# Patient Record
Sex: Female | Born: 1990 | Race: Asian | Hispanic: No | Marital: Married | Smoking: Never smoker
Health system: Southern US, Community
[De-identification: ages and names within clinical notes are randomized; demographics above are authoritative.]

## PROBLEM LIST (undated history)

## (undated) DIAGNOSIS — Z3490 Encounter for supervision of normal pregnancy, unspecified, unspecified trimester: Secondary | ICD-10-CM

## (undated) DIAGNOSIS — O24419 Gestational diabetes mellitus in pregnancy, unspecified control: Secondary | ICD-10-CM

## (undated) DIAGNOSIS — Z8632 Personal history of gestational diabetes: Secondary | ICD-10-CM

## (undated) HISTORY — PX: NO PAST SURGERIES: SHX2092

## (undated) HISTORY — DX: Personal history of gestational diabetes: Z86.32

## (undated) HISTORY — DX: Gestational diabetes mellitus in pregnancy, unspecified control: O24.419

---

## 2014-12-23 NOTE — L&D Delivery Note (Cosign Needed)
Delivery Note Peter CongoBich Cyndie Chimeguyen is a 24 y.o. female G1P0101 with an IUP at [id number] complicated by A1GDM who presented on 10/10/2015 with PPROM of unclear duration (2-7 days per patient). She was assessed to be 4 / 90% / 0 and was subsequently admitted to the Labor & Delivery unit. An epidural was placed at the patient's request. Penicillin was ordered but rapid GBS pcr returned negative and so that order was discontinued. She did not require augmentation.  At 5:07 PM a healthy female was delivered via spontaneous vaginal delivery (Presentation: Left Occiput Anterior).  APGAR: 8, 9; weight 4 lb 15.4 oz (2250 g).    Placenta status: Intact, Expressed, sent for pathology  Cord: 3 vessels  Complications: None.   Anesthesia: Epidural  Episiotomy: None Lacerations: bilateral vaginal side-wall and complex second degree perineal repaired with 3-0 vicryl. DRE performed, sphincter intact. Est. Blood Loss (mL): 83 ml  Mom to postpartum.  Baby to Nursery.  Gerri SporeCaitlin Sullivan 10/10/2015, 5:58 PM  OB FELLOW DELIVERY ATTESTATION   I was gloved and present for delivery in its entirety, I performed the repair, and I agree with the resident's note as above. Silvano BilisNoah B Wouk, MD 6:17 PM

## 2015-09-18 ENCOUNTER — Other Ambulatory Visit (HOSPITAL_COMMUNITY): Payer: Self-pay | Admitting: Nurse Practitioner

## 2015-09-18 DIAGNOSIS — Z3689 Encounter for other specified antenatal screening: Secondary | ICD-10-CM

## 2015-09-18 DIAGNOSIS — Z3A35 35 weeks gestation of pregnancy: Secondary | ICD-10-CM

## 2015-09-18 LAB — OB RESULTS CONSOLE HGB/HCT, BLOOD
HCT: 36 %
HEMOGLOBIN: 12 g/dL

## 2015-09-18 LAB — SICKLE CELL SCREEN
SICKLE CELL SCREEN: NORMAL
Sickle Cell Screen: NORMAL

## 2015-09-18 LAB — LEAD, BLOOD

## 2015-09-18 LAB — OB RESULTS CONSOLE GC/CHLAMYDIA
Chlamydia: NEGATIVE
Gonorrhea: NEGATIVE

## 2015-09-18 LAB — OB RESULTS CONSOLE RUBELLA ANTIBODY, IGM: Rubella: IMMUNE

## 2015-09-18 LAB — CYSTIC FIBROSIS DIAGNOSTIC STUDY: INTERPRETATION-CFDNA: NEGATIVE

## 2015-09-18 LAB — OB RESULTS CONSOLE PLATELET COUNT: PLATELETS: 234 10*3/uL

## 2015-09-18 LAB — OB RESULTS CONSOLE HEPATITIS B SURFACE ANTIGEN: Hepatitis B Surface Ag: NEGATIVE

## 2015-09-18 LAB — OB RESULTS CONSOLE RPR: RPR: NONREACTIVE

## 2015-09-18 LAB — GLUCOSE TOLERANCE, 1 HOUR (50G) W/O FASTING: GLUCOSE 1 HR PRENATAL, POC: 147 mg/dL

## 2015-09-20 LAB — GLUCOSE, 3 HOUR GESTATIONAL
GLUCOSE FASTING: 69 mg/dL (ref 60–109)
Glucose, GTT - 1 Hour: 192 mg/dL (ref ?–200)
Glucose, GTT - 2 Hour: 161 mg/dL — AB (ref ?–140)
Glucose, GTT - 3 Hour: 136 mg/dL (ref ?–140)

## 2015-09-25 ENCOUNTER — Encounter: Payer: Self-pay | Admitting: *Deleted

## 2015-09-25 ENCOUNTER — Encounter: Payer: Medicaid Other | Attending: Obstetrics & Gynecology | Admitting: *Deleted

## 2015-09-25 ENCOUNTER — Encounter: Payer: Self-pay | Admitting: Family Medicine

## 2015-09-25 ENCOUNTER — Ambulatory Visit (INDEPENDENT_AMBULATORY_CARE_PROVIDER_SITE_OTHER): Payer: Medicaid Other | Admitting: Family Medicine

## 2015-09-25 VITALS — BP 108/57 | HR 80 | Temp 98.0°F | Ht <= 58 in | Wt 99.0 lb

## 2015-09-25 DIAGNOSIS — O24410 Gestational diabetes mellitus in pregnancy, diet controlled: Secondary | ICD-10-CM | POA: Insufficient documentation

## 2015-09-25 DIAGNOSIS — Z713 Dietary counseling and surveillance: Secondary | ICD-10-CM | POA: Insufficient documentation

## 2015-09-25 DIAGNOSIS — O24419 Gestational diabetes mellitus in pregnancy, unspecified control: Secondary | ICD-10-CM

## 2015-09-25 DIAGNOSIS — Z8632 Personal history of gestational diabetes: Secondary | ICD-10-CM

## 2015-09-25 DIAGNOSIS — O0993 Supervision of high risk pregnancy, unspecified, third trimester: Secondary | ICD-10-CM

## 2015-09-25 DIAGNOSIS — O0990 Supervision of high risk pregnancy, unspecified, unspecified trimester: Secondary | ICD-10-CM | POA: Insufficient documentation

## 2015-09-25 HISTORY — DX: Personal history of gestational diabetes: Z86.32

## 2015-09-25 LAB — POCT URINALYSIS DIP (DEVICE)
BILIRUBIN URINE: NEGATIVE
GLUCOSE, UA: NEGATIVE mg/dL
Hgb urine dipstick: NEGATIVE
KETONES UR: NEGATIVE mg/dL
LEUKOCYTES UA: NEGATIVE
NITRITE: NEGATIVE
PH: 7 (ref 5.0–8.0)
Protein, ur: NEGATIVE mg/dL
Specific Gravity, Urine: 1.02 (ref 1.005–1.030)
Urobilinogen, UA: 0.2 mg/dL (ref 0.0–1.0)

## 2015-09-25 MED ORDER — ACCU-CHEK FASTCLIX LANCETS MISC: 1.0000 | Freq: Four times a day (QID) | Status: DC

## 2015-09-25 MED ORDER — GLUCOSE BLOOD VI STRP: ORAL_STRIP | Status: DC

## 2015-09-25 NOTE — Progress Notes (Signed)
Subjective:  Courtney Nguyen is a 24 y.o. G1P0 at Unknown being seen today for ongoing prenatal care.  Patient reports no complaints.  Contractions: Not present.  Vag. Bleeding: None. Movement: Present. Denies leaking of fluid.   Establishing care today for GDM  The following portions of the patient's history were reviewed and updated as appropriate: allergies, current medications, past family history, past medical history, past social history, past surgical history and problem list.   Objective:   Filed Vitals:   09/25/15 1026  BP: 108/57  Pulse: 80  Temp: 98 F (36.7 C)  Weight: 99 lb (44.906 kg)    Fetal Status: Fetal Heart Rate (bpm): 160   Movement: Present     General:  Alert, oriented and cooperative. Patient is in no acute distress.  Skin: Skin is warm and dry. No rash noted.   Cardiovascular: Normal heart rate noted  Respiratory: Normal respiratory effort, no problems with respiration noted  Abdomen: Soft, gravid, appropriate for gestational age. Pain/Pressure: Absent     Pelvic: Vag. Bleeding: None     Cervical exam deferred        Extremities: Normal range of motion.  Edema: None  Mental Status: Normal mood and affect. Normal behavior. Normal judgment and thought content.   Urinalysis:      Assessment and Plan:  Pregnancy: G1P0 at Unknown  1. Supervision of high risk pregnancy, antepartum, third trimester - added pregnancy box  - Reviewed records from [facility] - Obstetric panel  2. Gestational diabetes mellitus (GDM), antepartum, gestational diabetes method of control unspecified - ACCU-CHEK FASTCLIX LANCETS MISC; 1 each by Percutaneous route 4 (four) times daily. New DX GDM O24.419 for testing 4 times daily  Dispense: 100 each; Refill: 12 - glucose blood test strip; Test strip compatible with Accu-Check Aviva connect for testing 4 times daily GDM O24.419  Dispense: 100 each; Refill: 12  Preterm labor symptoms and general obstetric precautions including but not limited  to vaginal bleeding, contractions, leaking of fluid and fetal movement were reviewed in detail with the patient. Please refer to After Visit Summary for other counseling recommendations.   Return in about 1 week (around 10/02/2015) for Diabetes follow up, PNC. Future Appointments Date Time Provider Department Center  09/26/2015 7:30 AM [facility] [place] 3 [facility]  10/02/2015 9:05 AM [place], CNM [facility]   Federico Flake, [place]

## 2015-09-25 NOTE — Progress Notes (Signed)
  Patient was seen on 09/25/15 for Gestational Diabetes self-management . The following learning objectives were met by the patient :   States the definition of Gestational Diabetes  States why dietary management is important in controlling blood glucose  States when to check blood glucose levels  Demonstrates proper blood glucose monitoring techniques  States the effect of stress and exercise on blood glucose levels  States the importance of limiting caffeine and abstaining from alcohol and smoking  Plan:  Consider  increasing your activity level by walking daily as tolerated Begin checking BG before breakfast and 2 hours after first bit of breakfast, lunch and dinner after  as directed by MD  Take medication  as directed by MD  Blood glucose monitor given: [place] Lot # [id number] Exp: 05/22/16 Blood glucose reading: FBS 15m/dl  Patient instructed to monitor glucose levels: FBS: 60 - <90 2 hour: <120  Patient received the following handouts:  Nutrition Diabetes and Pregnancy  Carbohydrate Counting List  Meal Planning worksheet  Patient will be seen for follow-up as needed.

## 2015-09-25 NOTE — Patient Instructions (Signed)
Third Trimester of Pregnancy The third trimester is from week 29 through week 42, months 7 through 9. The third trimester is a time when the fetus is growing rapidly. At the end of the ninth month, the fetus is about 20 inches in length and weighs 6-10 pounds.  BODY CHANGES Your body goes through many changes during pregnancy. The changes vary from woman to woman.   Your weight will continue to increase. You can expect to gain 25-35 pounds (11-16 kg) by the end of the pregnancy.  You may begin to get stretch marks on your hips, abdomen, and breasts.  You may urinate more often because the fetus is moving lower into your pelvis and pressing on your bladder.  You may develop or continue to have heartburn as a result of your pregnancy.  You may develop constipation because certain hormones are causing the muscles that push waste through your intestines to slow down.  You may develop hemorrhoids or swollen, bulging veins (varicose veins).  You may have pelvic pain because of the weight gain and pregnancy hormones relaxing your joints between the bones in your pelvis. Backaches may result from overexertion of the muscles supporting your posture.  You may have changes in your hair. These can include thickening of your hair, rapid growth, and changes in texture. Some women also have hair loss during or after pregnancy, or hair that feels dry or thin. Your hair will most likely return to normal after your baby is born.  Your breasts will continue to grow and be tender. A yellow discharge may leak from your breasts called colostrum.  Your belly button may stick out.  You may feel short of breath because of your expanding uterus.  You may notice the fetus "dropping," or moving lower in your abdomen.  You may have a bloody mucus discharge. This usually occurs a few days to a week before labor begins.  Your cervix becomes thin and soft (effaced) near your due date. WHAT TO EXPECT AT YOUR PRENATAL  EXAMS  You will have prenatal exams every 2 weeks until week 36. Then, you will have weekly prenatal exams. During a routine prenatal visit:  You will be weighed to make sure you and the fetus are growing normally.  Your blood pressure is taken.  Your abdomen will be measured to track your baby's growth.  The fetal heartbeat will be listened to.  Any test results from the previous visit will be discussed.  You may have a cervical check near your due date to see if you have effaced. At around 36 weeks, your caregiver will check your cervix. At the same time, your caregiver will also perform a test on the secretions of the vaginal tissue. This test is to determine if a type of bacteria, Group B streptococcus, is present. Your caregiver will explain this further. Your caregiver may ask you:  What your birth plan is.  How you are feeling.  If you are feeling the baby move.  If you have had any abnormal symptoms, such as leaking fluid, bleeding, severe headaches, or abdominal cramping.  If you have any questions. Other tests or screenings that may be performed during your third trimester include:  Blood tests that check for low iron levels (anemia).  Fetal testing to check the health, activity level, and growth of the fetus. Testing is done if you have certain medical conditions or if there are problems during the pregnancy. FALSE LABOR You may feel small, irregular contractions that   eventually go away. These are called Braxton Hicks contractions, or false labor. Contractions may last for hours, days, or even weeks before true labor sets in. If contractions come at regular intervals, intensify, or become painful, it is best to be seen by your caregiver.  SIGNS OF LABOR   Menstrual-like cramps.  Contractions that are 5 minutes apart or less.  Contractions that start on the top of the uterus and spread down to the lower abdomen and back.  A sense of increased pelvic pressure or back  pain.  A watery or bloody mucus discharge that comes from the vagina. If you have any of these signs before the 37th week of pregnancy, call your caregiver right away. You need to go to the hospital to get checked immediately. HOME CARE INSTRUCTIONS   Avoid all smoking, herbs, alcohol, and unprescribed drugs. These chemicals affect the formation and growth of the baby.  Follow your caregiver's instructions regarding medicine use. There are medicines that are either safe or unsafe to take during pregnancy.  Exercise only as directed by your caregiver. Experiencing uterine cramps is a good sign to stop exercising.  Continue to eat regular, healthy meals.  Wear a good support bra for breast tenderness.  Do not use hot tubs, steam rooms, or saunas.  Wear your seat belt at all times when driving.  Avoid raw meat, uncooked cheese, cat litter boxes, and soil used by cats. These carry germs that can cause birth defects in the baby.  Take your prenatal vitamins.  Try taking a stool softener (if your caregiver approves) if you develop constipation. Eat more high-fiber foods, such as fresh vegetables or fruit and whole grains. Drink plenty of fluids to keep your urine clear or pale yellow.  Take warm sitz baths to soothe any pain or discomfort caused by hemorrhoids. Use hemorrhoid cream if your caregiver approves.  If you develop varicose veins, wear support hose. Elevate your feet for 15 minutes, 3-4 times a day. Limit salt in your diet.  Avoid heavy lifting, wear low heal shoes, and practice good posture.  Rest a lot with your legs elevated if you have leg cramps or low back pain.  Visit your dentist if you have not gone during your pregnancy. Use a soft toothbrush to brush your teeth and be gentle when you floss.  A sexual relationship may be continued unless your caregiver directs you otherwise.  Do not travel far distances unless it is absolutely necessary and only with the approval  of your caregiver.  Take prenatal classes to understand, practice, and ask questions about the labor and delivery.  Make a trial run to the hospital.  Pack your hospital bag.  Prepare the baby's nursery.  Continue to go to all your prenatal visits as directed by your caregiver. SEEK MEDICAL CARE IF:  You are unsure if you are in labor or if your water has broken.  You have dizziness.  You have mild pelvic cramps, pelvic pressure, or nagging pain in your abdominal area.  You have persistent nausea, vomiting, or diarrhea.  You have a bad smelling vaginal discharge.  You have pain with urination. SEEK IMMEDIATE MEDICAL CARE IF:   You have a fever.  You are leaking fluid from your vagina.  You have spotting or bleeding from your vagina.  You have severe abdominal cramping or pain.  You have rapid weight loss or gain.  You have shortness of breath with chest pain.  You notice sudden or extreme swelling   of your face, hands, ankles, feet, or legs.  You have not felt your baby move in over an hour.  You have severe headaches that do not go away with medicine.  You have vision changes. Document Released: 12/03/2001 Document Revised: 12/14/2013 Document Reviewed: 02/09/2013 Exit[employer] Patient Information 2015 [employer]. This information is not intended to replace advice given to you by your health care provider. Make sure you discuss any questions you have with your health care provider.  

## 2015-09-25 NOTE — Progress Notes (Signed)
Initial prenatal packet given Breastfeeding tip of the week reviewed

## 2015-09-25 NOTE — Progress Notes (Signed)
Nutrition Note: GDM diet education  Pt reports eating 3 meals and 2 snacks daily.  Pt is taking PNV.  Pt reports no N& V or heartburn. NKFA. Pt received verbal and written education on GDM diet.  Discussed weight gain goals of 25-35 # for pregnancy. Pt agrees to continue to take PNV. Client plans to BF. Pt has WIC.  F/u in 4-6 weeks.  Carloyn Manner, MS, RD, LDN

## 2015-09-26 ENCOUNTER — Ambulatory Visit (HOSPITAL_COMMUNITY)
Admission: RE | Admit: 2015-09-26 | Discharge: 2015-09-26 | Disposition: A | Discharge status: Home / Self Care | Payer: Medicaid Other | Source: Ambulatory Visit | Attending: Nurse Practitioner | Admitting: Nurse Practitioner

## 2015-09-26 ENCOUNTER — Other Ambulatory Visit (HOSPITAL_COMMUNITY): Payer: Self-pay | Admitting: Nurse Practitioner

## 2015-09-26 DIAGNOSIS — Z3A35 35 weeks gestation of pregnancy: Secondary | ICD-10-CM

## 2015-09-26 DIAGNOSIS — Z36 Encounter for antenatal screening of mother: Secondary | ICD-10-CM | POA: Diagnosis not present

## 2015-09-26 DIAGNOSIS — O24419 Gestational diabetes mellitus in pregnancy, unspecified control: Secondary | ICD-10-CM | POA: Diagnosis not present

## 2015-09-26 DIAGNOSIS — O0933 Supervision of pregnancy with insufficient antenatal care, third trimester: Secondary | ICD-10-CM | POA: Diagnosis not present

## 2015-09-26 DIAGNOSIS — Z3687 Encounter for antenatal screening for uncertain dates: Secondary | ICD-10-CM

## 2015-09-26 DIAGNOSIS — Z3A33 33 weeks gestation of pregnancy: Secondary | ICD-10-CM

## 2015-09-26 DIAGNOSIS — Z3689 Encounter for other specified antenatal screening: Secondary | ICD-10-CM

## 2015-09-26 LAB — OBSTETRIC PANEL
Antibody Screen: NEGATIVE
Basophils Absolute: 0 K/uL (ref 0.0–0.1)
Basophils Relative: 0 % (ref 0–1)
Eosinophils Absolute: 0.1 K/uL (ref 0.0–0.7)
Eosinophils Relative: 1 % (ref 0–5)
HCT: 37.4 % (ref 36.0–46.0)
Hemoglobin: 12.7 g/dL (ref 12.0–15.0)
Hepatitis B Surface Ag: NEGATIVE
Lymphocytes Relative: 24 % (ref 12–46)
Lymphs Abs: 1.6 K/uL (ref 0.7–4.0)
MCH: 29.9 pg (ref 26.0–34.0)
MCHC: 34 g/dL (ref 30.0–36.0)
MCV: 88 fL (ref 78.0–100.0)
MPV: 9.5 fL (ref 8.6–12.4)
Monocytes Absolute: 0.6 K/uL (ref 0.1–1.0)
Monocytes Relative: 9 % (ref 3–12)
Neutro Abs: 4.5 K/uL (ref 1.7–7.7)
Neutrophils Relative %: 66 % (ref 43–77)
Platelets: 234 K/uL (ref 150–400)
RBC: 4.25 MIL/uL (ref 3.87–5.11)
RDW: 13 % (ref 11.5–15.5)
Rh Type: POSITIVE
Rubella: 0.3 {index}
WBC: 6.8 K/uL (ref 4.0–10.5)

## 2015-09-27 ENCOUNTER — Encounter: Payer: Self-pay | Admitting: *Deleted

## 2015-10-02 ENCOUNTER — Encounter: Payer: Self-pay | Admitting: Advanced Practice Midwife

## 2015-10-02 ENCOUNTER — Ambulatory Visit (INDEPENDENT_AMBULATORY_CARE_PROVIDER_SITE_OTHER): Payer: Medicaid Other | Admitting: Advanced Practice Midwife

## 2015-10-02 VITALS — BP 102/60 | HR 58 | Temp 98.7°F | Wt 100.2 lb

## 2015-10-02 DIAGNOSIS — O24410 Gestational diabetes mellitus in pregnancy, diet controlled: Secondary | ICD-10-CM

## 2015-10-02 DIAGNOSIS — Z113 Encounter for screening for infections with a predominantly sexual mode of transmission: Secondary | ICD-10-CM

## 2015-10-02 DIAGNOSIS — O0993 Supervision of high risk pregnancy, unspecified, third trimester: Secondary | ICD-10-CM | POA: Diagnosis not present

## 2015-10-02 LAB — POCT URINALYSIS DIP (DEVICE)
Bilirubin Urine: NEGATIVE
GLUCOSE, UA: NEGATIVE mg/dL
Ketones, ur: 15 mg/dL — AB
Nitrite: NEGATIVE
PROTEIN: NEGATIVE mg/dL
Specific Gravity, Urine: 1.015 (ref 1.005–1.030)
UROBILINOGEN UA: 0.2 mg/dL (ref 0.0–1.0)
pH: 7 (ref 5.0–8.0)

## 2015-10-02 NOTE — Progress Notes (Signed)
Ultrasound scheduled for 10/30/2015 :30am

## 2015-10-02 NOTE — Patient Instructions (Signed)
Based on your ultrasound 09/26/15 we have changed your estimated due date to 11/13/15.   Braxton Hicks Contractions Contractions of the uterus can occur throughout pregnancy. Contractions are not always a sign that you are in labor.  WHAT ARE BRAXTON HICKS CONTRACTIONS?  Contractions that occur before labor are called Braxton Hicks contractions, or false labor. Toward the end of pregnancy (32-34 weeks), these contractions can develop more often and may become more forceful. This is not true labor because these contractions do not result in opening (dilatation) and thinning of the cervix. They are sometimes difficult to tell apart from true labor because these contractions can be forceful and people have different pain tolerances. You should not feel embarrassed if you go to the hospital with false labor. Sometimes, the only way to tell if you are in true labor is for your health care provider to look for changes in the cervix. If there are no prenatal problems or other health problems associated with the pregnancy, it is completely safe to be sent home with false labor and await the onset of true labor. HOW CAN YOU TELL THE DIFFERENCE BETWEEN TRUE AND FALSE LABOR? False Labor  The contractions of false labor are usually shorter and not as hard as those of true labor.   The contractions are usually irregular.   The contractions are often felt in the front of the lower abdomen and in the groin.   The contractions may go away when you walk around or change positions while lying down.   The contractions get weaker and are shorter lasting as time goes on.   The contractions do not usually become progressively stronger, regular, and closer together as with true labor.  True Labor 1. Contractions in true labor last 30-70 seconds, become very regular, usually become more intense, and increase in frequency.  2. The contractions do not go away with walking.  3. The discomfort is usually felt in  the top of the uterus and spreads to the lower abdomen and low back.  4. True labor can be determined by your health care provider with an exam. This will show that the cervix is dilating and getting thinner.  WHAT TO REMEMBER  Keep up with your usual exercises and follow other instructions given by your health care provider.   Take medicines as directed by your health care provider.   Keep your regular prenatal appointments.   Eat and drink lightly if you think you are going into labor.   If Braxton Hicks contractions are making you uncomfortable:   Change your position from lying down or resting to walking, or from walking to resting.   Sit and rest in a tub of warm water.   Drink 2-3 glasses of water. Dehydration may cause these contractions.   Do slow and deep breathing several times an hour.  WHEN SHOULD I SEEK IMMEDIATE MEDICAL CARE? Seek immediate medical care if:  Your contractions become stronger, more regular, and closer together.   You have fluid leaking or gushing from your vagina.   You have a fever.   You pass blood-tinged mucus.   You have vaginal bleeding.   You have continuous abdominal pain.   You have low back pain that you never had before.   You feel your baby's head pushing down and causing pelvic pressure.   Your baby is not moving as much as it used to.    This information is not intended to replace advice given to you by  your health care provider. Make sure you discuss any questions you have with your health care provider.   Document Released: 12/09/2005 Document Revised: 12/14/2013 Document Reviewed: 09/20/2013 Elsevier Interactive Patient Education 2016 [employer]  Fetal Movement Counts Patient Name: __________________________________________________ Patient Due Date: ____________________ Performing a fetal movement count is highly recommended in high-risk pregnancies, but it is good for every pregnant woman to do.  Your health care provider may ask you to start counting fetal movements at 28 weeks of the pregnancy. Fetal movements often increase:  After eating a full meal.  After physical activity.  After eating or drinking something sweet or cold.  At rest. Pay attention to when you feel the baby is most active. This will help you notice a pattern of your baby's sleep and wake cycles and what factors contribute to an increase in fetal movement. It is important to perform a fetal movement count at the same time each day when your baby is normally most active.  HOW TO COUNT FETAL MOVEMENTS 5. Find a quiet and comfortable area to sit or lie down on your left side. Lying on your left side provides the best blood and oxygen circulation to your baby. 6. Write down the day and time on a sheet of paper or in a journal. 7. Start counting kicks, flutters, swishes, rolls, or jabs in a 2-hour period. You should feel at least 10 movements within 2 hours. 8. If you do not feel 10 movements in 2 hours, wait 2-3 hours and count again. Look for a change in the pattern or not enough counts in 2 hours. SEEK MEDICAL CARE IF:  You feel less than 10 counts in 2 hours, tried twice.  There is no movement in over an hour.  The pattern is changing or taking longer each day to reach 10 counts in 2 hours.  You feel the baby is not moving as he or she usually does. Date: ____________ Movements: ____________ Start time: ____________ Doreatha Martin time: ____________  Date: ____________ Movements: ____________ Start time: ____________ Doreatha Martin time: ____________ Date: ____________ Movements: ____________ Start time: ____________ Doreatha Martin time: ____________ Date: ____________ Movements: ____________ Start time: ____________ Doreatha Martin time: ____________ Date: ____________ Movements: ____________ Start time: ____________ Doreatha Martin time: ____________ Date: ____________ Movements: ____________ Start time: ____________ Doreatha Martin time: ____________ Date:  ____________ Movements: ____________ Start time: ____________ Doreatha Martin time: ____________ Date: ____________ Movements: ____________ Start time: ____________ Doreatha Martin time: ____________  Date: ____________ Movements: ____________ Start time: ____________ Doreatha Martin time: ____________ Date: ____________ Movements: ____________ Start time: ____________ Doreatha Martin time: ____________ Date: ____________ Movements: ____________ Start time: ____________ Doreatha Martin time: ____________ Date: ____________ Movements: ____________ Start time: ____________ Doreatha Martin time: ____________ Date: ____________ Movements: ____________ Start time: ____________ Doreatha Martin time: ____________ Date: ____________ Movements: ____________ Start time: ____________ Doreatha Martin time: ____________ Date: ____________ Movements: ____________ Start time: ____________ Doreatha Martin time: ____________  Date: ____________ Movements: ____________ Start time: ____________ Doreatha Martin time: ____________ Date: ____________ Movements: ____________ Start time: ____________ Doreatha Martin time: ____________ Date: ____________ Movements: ____________ Start time: ____________ Doreatha Martin time: ____________ Date: ____________ Movements: ____________ Start time: ____________ Doreatha Martin time: ____________ Date: ____________ Movements: ____________ Start time: ____________ Doreatha Martin time: ____________ Date: ____________ Movements: ____________ Start time: ____________ Doreatha Martin time: ____________ Date: ____________ Movements: ____________ Start time: ____________ Doreatha Martin time: ____________  Date: ____________ Movements: ____________ Start time: ____________ Doreatha Martin time: ____________ Date: ____________ Movements: ____________ Start time: ____________ Doreatha Martin time: ____________ Date: ____________ Movements: ____________ Start time: ____________ Doreatha Martin time: ____________ Date: ____________ Movements: ____________ Start time: ____________ Doreatha Martin time: ____________ Date: ____________ Movements:  ____________ Start  time: ____________ Doreatha Martin time: ____________ Date: ____________ Movements: ____________ Start time: ____________ Doreatha Martin time: ____________ Date: ____________ Movements: ____________ Start time: ____________ Doreatha Martin time: ____________  Date: ____________ Movements: ____________ Start time: ____________ Doreatha Martin time: ____________ Date: ____________ Movements: ____________ Start time: ____________ Doreatha Martin time: ____________ Date: ____________ Movements: ____________ Start time: ____________ Doreatha Martin time: ____________ Date: ____________ Movements: ____________ Start time: ____________ Doreatha Martin time: ____________ Date: ____________ Movements: ____________ Start time: ____________ Doreatha Martin time: ____________ Date: ____________ Movements: ____________ Start time: ____________ Doreatha Martin time: ____________ Date: ____________ Movements: ____________ Start time: ____________ Doreatha Martin time: ____________  Date: ____________ Movements: ____________ Start time: ____________ Doreatha Martin time: ____________ Date: ____________ Movements: ____________ Start time: ____________ Doreatha Martin time: ____________ Date: ____________ Movements: ____________ Start time: ____________ Doreatha Martin time: ____________ Date: ____________ Movements: ____________ Start time: ____________ Doreatha Martin time: ____________ Date: ____________ Movements: ____________ Start time: ____________ Doreatha Martin time: ____________ Date: ____________ Movements: ____________ Start time: ____________ Doreatha Martin time: ____________ Date: ____________ Movements: ____________ Start time: ____________ Doreatha Martin time: ____________  Date: ____________ Movements: ____________ Start time: ____________ Doreatha Martin time: ____________ Date: ____________ Movements: ____________ Start time: ____________ Doreatha Martin time: ____________ Date: ____________ Movements: ____________ Start time: ____________ Doreatha Martin time: ____________ Date: ____________ Movements: ____________ Start time: ____________ Doreatha Martin time:  ____________ Date: ____________ Movements: ____________ Start time: ____________ Doreatha Martin time: ____________ Date: ____________ Movements: ____________ Start time: ____________ Doreatha Martin time: ____________ Date: ____________ Movements: ____________ Start time: ____________ Doreatha Martin time: ____________  Date: ____________ Movements: ____________ Start time: ____________ Doreatha Martin time: ____________ Date: ____________ Movements: ____________ Start time: ____________ Doreatha Martin time: ____________ Date: ____________ Movements: ____________ Start time: ____________ Doreatha Martin time: ____________ Date: ____________ Movements: ____________ Start time: ____________ Doreatha Martin time: ____________ Date: ____________ Movements: ____________ Start time: ____________ Doreatha Martin time: ____________ Date: ____________ Movements: ____________ Start time: ____________ Doreatha Martin time: ____________   This information is not intended to replace advice given to you by your health care provider. Make sure you discuss any questions you have with your health care provider.   Document Released: 01/08/2007 Document Revised: 12/30/2014 Document Reviewed: 10/05/2012 Elsevier Interactive Patient Education [employer]

## 2015-10-02 NOTE — Progress Notes (Signed)
Fasting CBGs: 63-101 (14% out of range) PCB: 89-154 (33% out of range) PCL: 86-174 (16% out of range) PCD: 86-150 (33% out of range)  Subjective:  Courtney Nguyen is a 24 y.o. G1P0 at [redacted]w[redacted]d being seen today for ongoing prenatal care.  Patient reports no complaints.  Contractions: Not present.  Vag. Bleeding: None. Movement: Present. Denies leaking of fluid.   The following portions of the patient's history were reviewed and updated as appropriate: allergies, current medications, past family history, past medical history, past social history, past surgical history and problem list. Problem list updated.  Objective:   Had anatomy scan 09/26/15. No other US's this pregnancy. Measuring 2 weeks behind, symmetric measurements. Changed EDD to 11/13/15. Explained to Pt.   Filed Vitals:   10/02/15 1021  BP: 102/60  Pulse: 58  Temp: 98.7 F (37.1 C)  Weight: 100 lb 3.2 oz (45.45 kg)    Fetal Status: Fetal Heart Rate (bpm): 158   Movement: Present     General:  Alert, oriented and cooperative. Patient is in no acute distress.  Skin: Skin is warm and dry. No rash noted.   Cardiovascular: Normal heart rate noted  Respiratory: Normal respiratory effort, no problems with respiration noted  Abdomen: Soft, gravid, appropriate for gestational age. Pain/Pressure: Absent     Pelvic: Vag. Bleeding: None     Cervical exam deferred        Extremities: Normal range of motion.  Edema: None  Mental Status: Normal mood and affect. Normal behavior. Normal judgment and thought content.   Urinalysis: Urine Protein: Negative Urine Glucose: Negative  Assessment and Plan:  Pregnancy: G1P0 at [redacted]w[redacted]d  1. Diet controlled gestational diabetes mellitus in third trimester  - [place] MFM OB FOLLOW UP; Future  Preterm labor symptoms and general obstetric precautions including but not limited to vaginal bleeding, contractions, leaking of fluid and fetal movement were reviewed in detail with the patient. Please refer to  After Visit Summary for other counseling recommendations.  GBS at [place].  Growth [place] 4 weeks. Return in about 2 weeks (around 10/16/2015).   Dorathy Kinsman, CNM

## 2015-10-02 NOTE — Progress Notes (Signed)
Breastfeeding tip of the week reviewed Cultures today Urine trace ketones, trace urine, small amt leukocytes

## 2015-10-03 ENCOUNTER — Encounter: Payer: Self-pay | Admitting: *Deleted

## 2015-10-10 ENCOUNTER — Encounter (HOSPITAL_COMMUNITY): Payer: Self-pay | Admitting: *Deleted

## 2015-10-10 ENCOUNTER — Inpatient Hospital Stay (HOSPITAL_COMMUNITY): Payer: Medicaid Other | Admitting: Anesthesiology

## 2015-10-10 ENCOUNTER — Inpatient Hospital Stay (HOSPITAL_COMMUNITY)
Admission: AD | Admit: 2015-10-10 | Discharge: 2015-10-12 | DRG: 775 | Disposition: A | Discharge status: Home / Self Care | Payer: Medicaid Other | Source: Ambulatory Visit | Attending: Obstetrics and Gynecology | Admitting: Obstetrics and Gynecology

## 2015-10-10 DIAGNOSIS — O24419 Gestational diabetes mellitus in pregnancy, unspecified control: Secondary | ICD-10-CM | POA: Diagnosis present

## 2015-10-10 DIAGNOSIS — Z8249 Family history of ischemic heart disease and other diseases of the circulatory system: Secondary | ICD-10-CM | POA: Diagnosis not present

## 2015-10-10 DIAGNOSIS — O42913 Preterm premature rupture of membranes, unspecified as to length of time between rupture and onset of labor, third trimester: Secondary | ICD-10-CM | POA: Diagnosis not present

## 2015-10-10 DIAGNOSIS — Z789 Other specified health status: Secondary | ICD-10-CM

## 2015-10-10 DIAGNOSIS — O24420 Gestational diabetes mellitus in childbirth, diet controlled: Secondary | ICD-10-CM | POA: Diagnosis present

## 2015-10-10 DIAGNOSIS — Z825 Family history of asthma and other chronic lower respiratory diseases: Secondary | ICD-10-CM | POA: Diagnosis not present

## 2015-10-10 DIAGNOSIS — O24429 Gestational diabetes mellitus in childbirth, unspecified control: Secondary | ICD-10-CM | POA: Diagnosis not present

## 2015-10-10 DIAGNOSIS — Z3A35 35 weeks gestation of pregnancy: Secondary | ICD-10-CM

## 2015-10-10 DIAGNOSIS — O42919 Preterm premature rupture of membranes, unspecified as to length of time between rupture and onset of labor, unspecified trimester: Secondary | ICD-10-CM | POA: Diagnosis present

## 2015-10-10 DIAGNOSIS — Z833 Family history of diabetes mellitus: Secondary | ICD-10-CM | POA: Diagnosis not present

## 2015-10-10 DIAGNOSIS — Z823 Family history of stroke: Secondary | ICD-10-CM | POA: Diagnosis not present

## 2015-10-10 DIAGNOSIS — O0990 Supervision of high risk pregnancy, unspecified, unspecified trimester: Secondary | ICD-10-CM

## 2015-10-10 HISTORY — DX: Encounter for supervision of normal pregnancy, unspecified, unspecified trimester: Z34.90

## 2015-10-10 LAB — URINE MICROSCOPIC-ADD ON

## 2015-10-10 LAB — URINALYSIS, ROUTINE W REFLEX MICROSCOPIC
BILIRUBIN URINE: NEGATIVE
Glucose, UA: NEGATIVE mg/dL
KETONES UR: NEGATIVE mg/dL
Leukocytes, UA: NEGATIVE
NITRITE: NEGATIVE
PH: 7 (ref 5.0–8.0)
Protein, ur: NEGATIVE mg/dL
SPECIFIC GRAVITY, URINE: 1.01 (ref 1.005–1.030)
UROBILINOGEN UA: 0.2 mg/dL (ref 0.0–1.0)

## 2015-10-10 LAB — OB RESULTS CONSOLE GBS: STREP GROUP B AG: NEGATIVE

## 2015-10-10 LAB — RAPID HIV SCREEN (HIV 1/2 AB+AG)
HIV 1/2 Antibodies: NONREACTIVE
HIV-1 P24 ANTIGEN - HIV24: NONREACTIVE

## 2015-10-10 LAB — CBC
HEMATOCRIT: 37 % (ref 36.0–46.0)
Hemoglobin: 12.3 g/dL (ref 12.0–15.0)
MCH: 29.8 pg (ref 26.0–34.0)
MCHC: 33.2 g/dL (ref 30.0–36.0)
MCV: 89.6 fL (ref 78.0–100.0)
PLATELETS: 224 10*3/uL (ref 150–400)
RBC: 4.13 MIL/uL (ref 3.87–5.11)
RDW: 12.9 % (ref 11.5–15.5)
WBC: 14 10*3/uL — ABNORMAL HIGH (ref 4.0–10.5)

## 2015-10-10 LAB — TYPE AND SCREEN
ABO/RH(D): O POS
Antibody Screen: NEGATIVE

## 2015-10-10 LAB — ABO/RH: ABO/RH(D): O POS

## 2015-10-10 LAB — GLUCOSE, CAPILLARY
GLUCOSE-CAPILLARY: 166 mg/dL — AB (ref 65–99)
Glucose-Capillary: 91 mg/dL (ref 65–99)
Glucose-Capillary: 97 mg/dL (ref 65–99)

## 2015-10-10 LAB — GROUP B STREP BY PCR: GROUP B STREP BY PCR: NEGATIVE

## 2015-10-10 LAB — POCT FERN TEST: POCT FERN TEST: POSITIVE

## 2015-10-10 LAB — OB RESULTS CONSOLE HIV ANTIBODY (ROUTINE TESTING): HIV: NONREACTIVE

## 2015-10-10 MED ORDER — DIBUCAINE 1 % RE OINT: 1.0000 "application " | TOPICAL_OINTMENT | RECTAL | Status: DC | PRN

## 2015-10-10 MED ORDER — FLEET ENEMA 7-19 GM/118ML RE ENEM: 1.0000 | ENEMA | RECTAL | Status: DC | PRN

## 2015-10-10 MED ORDER — DIPHENHYDRAMINE HCL 50 MG/ML IJ SOLN: 12.5000 mg | INTRAMUSCULAR | Status: DC | PRN

## 2015-10-10 MED ORDER — EPHEDRINE 5 MG/ML INJ
10.0000 mg | INTRAVENOUS | Status: DC | PRN
  Filled 2015-10-10: qty 2

## 2015-10-10 MED ORDER — ZOLPIDEM TARTRATE 5 MG PO TABS: 5.0000 mg | ORAL_TABLET | Freq: Every evening | ORAL | Status: DC | PRN

## 2015-10-10 MED ORDER — CITRIC ACID-SODIUM CITRATE 334-500 MG/5ML PO SOLN: 30.0000 mL | ORAL | Status: DC | PRN

## 2015-10-10 MED ORDER — LIDOCAINE HCL (PF) 1 % IJ SOLN
INTRAMUSCULAR | Status: DC | PRN
  Administered 2015-10-10 (×2): 4 mL

## 2015-10-10 MED ORDER — OXYTOCIN 40 UNITS IN LACTATED RINGERS INFUSION - SIMPLE MED
62.5000 mL/h | INTRAVENOUS | Status: DC
  Administered 2015-10-10: 62.5 mL/h via INTRAVENOUS
  Filled 2015-10-10 (×2): qty 1000

## 2015-10-10 MED ORDER — ONDANSETRON HCL 4 MG/2ML IJ SOLN: 4.0000 mg | Freq: Four times a day (QID) | INTRAMUSCULAR | Status: DC | PRN

## 2015-10-10 MED ORDER — INSULIN ASPART 100 UNIT/ML ~~LOC~~ SOLN: 0.0000 [IU] | Freq: Three times a day (TID) | SUBCUTANEOUS | Status: DC

## 2015-10-10 MED ORDER — LACTATED RINGERS IV SOLN
INTRAVENOUS | Status: DC
  Administered 2015-10-10 (×2): via INTRAVENOUS

## 2015-10-10 MED ORDER — IBUPROFEN 600 MG PO TABS
600.0000 mg | ORAL_TABLET | Freq: Four times a day (QID) | ORAL | Status: DC
  Administered 2015-10-10 – 2015-10-12 (×8): 600 mg via ORAL
  Filled 2015-10-10 (×8): qty 1

## 2015-10-10 MED ORDER — SENNOSIDES-DOCUSATE SODIUM 8.6-50 MG PO TABS
2.0000 | ORAL_TABLET | ORAL | Status: DC
  Administered 2015-10-11 – 2015-10-12 (×2): 2 via ORAL
  Filled 2015-10-10 (×2): qty 2

## 2015-10-10 MED ORDER — TETANUS-DIPHTH-ACELL PERTUSSIS 5-2.5-18.5 LF-MCG/0.5 IM SUSP: 0.5000 mL | Freq: Once | INTRAMUSCULAR | Status: DC

## 2015-10-10 MED ORDER — LACTATED RINGERS IV SOLN: 500.0000 mL | INTRAVENOUS | Status: DC | PRN

## 2015-10-10 MED ORDER — OXYCODONE-ACETAMINOPHEN 5-325 MG PO TABS: 1.0000 | ORAL_TABLET | ORAL | Status: DC | PRN

## 2015-10-10 MED ORDER — WITCH HAZEL-GLYCERIN EX PADS: 1.0000 "application " | MEDICATED_PAD | CUTANEOUS | Status: DC | PRN

## 2015-10-10 MED ORDER — ONDANSETRON HCL 4 MG PO TABS: 4.0000 mg | ORAL_TABLET | ORAL | Status: DC | PRN

## 2015-10-10 MED ORDER — ACETAMINOPHEN 325 MG PO TABS: 650.0000 mg | ORAL_TABLET | ORAL | Status: DC | PRN

## 2015-10-10 MED ORDER — BETAMETHASONE SOD PHOS & ACET 6 (3-3) MG/ML IJ SUSP
12.0000 mg | Freq: Once | INTRAMUSCULAR | Status: AC
  Administered 2015-10-10: 12 mg via INTRAMUSCULAR
  Filled 2015-10-10: qty 2

## 2015-10-10 MED ORDER — SIMETHICONE 80 MG PO CHEW: 80.0000 mg | CHEWABLE_TABLET | ORAL | Status: DC | PRN

## 2015-10-10 MED ORDER — PENICILLIN G POTASSIUM 5000000 UNITS IJ SOLR
5.0000 10*6.[IU] | Freq: Once | INTRAMUSCULAR | Status: DC
  Filled 2015-10-10: qty 5

## 2015-10-10 MED ORDER — DIPHENHYDRAMINE HCL 25 MG PO CAPS: 25.0000 mg | ORAL_CAPSULE | Freq: Four times a day (QID) | ORAL | Status: DC | PRN

## 2015-10-10 MED ORDER — FENTANYL 2.5 MCG/ML BUPIVACAINE 1/10 % EPIDURAL INFUSION (WH - ANES)
14.0000 mL/h | INTRAMUSCULAR | Status: DC | PRN
  Administered 2015-10-10 (×2): 14 mL/h via EPIDURAL
  Filled 2015-10-10: qty 125

## 2015-10-10 MED ORDER — INSULIN ASPART 100 UNIT/ML ~~LOC~~ SOLN: 0.0000 [IU] | Freq: Every day | SUBCUTANEOUS | Status: DC

## 2015-10-10 MED ORDER — OXYCODONE-ACETAMINOPHEN 5-325 MG PO TABS: 2.0000 | ORAL_TABLET | ORAL | Status: DC | PRN

## 2015-10-10 MED ORDER — DOCUSATE SODIUM 100 MG PO CAPS
100.0000 mg | ORAL_CAPSULE | Freq: Two times a day (BID) | ORAL | Status: DC
  Administered 2015-10-10 – 2015-10-11 (×3): 100 mg via ORAL
  Filled 2015-10-10 (×3): qty 1

## 2015-10-10 MED ORDER — OXYTOCIN BOLUS FROM INFUSION
500.0000 mL | INTRAVENOUS | Status: DC
  Administered 2015-10-10: 500 mL via INTRAVENOUS

## 2015-10-10 MED ORDER — PENICILLIN G POTASSIUM 5000000 UNITS IJ SOLR
2.5000 10*6.[IU] | INTRAVENOUS | Status: DC
  Filled 2015-10-10: qty 2.5

## 2015-10-10 MED ORDER — LANOLIN HYDROUS EX OINT: TOPICAL_OINTMENT | CUTANEOUS | Status: DC | PRN

## 2015-10-10 MED ORDER — ONDANSETRON HCL 4 MG/2ML IJ SOLN
4.0000 mg | INTRAMUSCULAR | Status: DC | PRN
Start: 2015-10-10 — End: 2015-10-12

## 2015-10-10 MED ORDER — PRENATAL MULTIVITAMIN CH
1.0000 | ORAL_TABLET | Freq: Every day | ORAL | Status: DC
  Administered 2015-10-11: 1 via ORAL
  Filled 2015-10-10 (×2): qty 1

## 2015-10-10 MED ORDER — LIDOCAINE HCL (PF) 1 % IJ SOLN
30.0000 mL | INTRAMUSCULAR | Status: DC | PRN
  Filled 2015-10-10 (×2): qty 30

## 2015-10-10 MED ORDER — PHENYLEPHRINE 40 MCG/ML (10ML) SYRINGE FOR IV PUSH (FOR BLOOD PRESSURE SUPPORT)
80.0000 ug | PREFILLED_SYRINGE | INTRAVENOUS | Status: DC | PRN
  Filled 2015-10-10: qty 2
  Filled 2015-10-10: qty 20

## 2015-10-10 MED ORDER — BENZOCAINE-MENTHOL 20-0.5 % EX AERO
1.0000 "application " | INHALATION_SPRAY | CUTANEOUS | Status: DC | PRN
  Filled 2015-10-10 (×2): qty 56

## 2015-10-10 MED ORDER — INFLUENZA VAC SPLIT QUAD 0.5 ML IM SUSY: 0.5000 mL | PREFILLED_SYRINGE | INTRAMUSCULAR | Status: DC

## 2015-10-10 NOTE — Progress Notes (Signed)
Dr Lendell CapriceSullivan at bedside. EFM reviewed

## 2015-10-10 NOTE — MAU Note (Signed)
Urine sent to lab @1030 .

## 2015-10-10 NOTE — Plan of Care (Signed)
Problem: Consults Goal: Birthing Suites Patient Information Press F2 to bring up selections list  Outcome: Completed/Met Date Met:  10/10/15  Pt < [redacted] weeks EGA

## 2015-10-10 NOTE — Progress Notes (Signed)
Pt sitting up at 1156 and recorded Mat. HR

## 2015-10-10 NOTE — Progress Notes (Signed)
Dr Ashok PallWouk in. Spec exam and pooling noted. Pt states first leaked fld few days ago and then again 0700 today. Clear fld noted pooling on spec exam

## 2015-10-10 NOTE — Progress Notes (Signed)
-   GBS collected and sent.

## 2015-10-10 NOTE — Plan of Care (Signed)
Problem: Phase II Progression Outcomes Goal: Initiate breastfeeding within 1hr delivery Outcome: Not Met (add Reason) Infant's respirations are too high to attempt feeding at this time.

## 2015-10-10 NOTE — Anesthesia Procedure Notes (Signed)
Epidural Patient location during procedure: OB  Staffing Anesthesiologist: Qaadir Kent Performed by: anesthesiologist   Preanesthetic Checklist Completed: patient identified, site marked, surgical consent, pre-op evaluation, timeout performed, IV checked, risks and benefits discussed and monitors and equipment checked  Epidural Patient position: sitting Prep: site prepped and draped and DuraPrep Patient monitoring: continuous pulse ox and blood pressure Approach: midline Location: L3-L4 Injection technique: LOR saline  Needle:  Needle type: Tuohy  Needle gauge: 17 G Needle length: 9 cm and 9 Needle insertion depth: 3.5 cm Catheter type: closed end flexible Catheter size: 19 Gauge Catheter at skin depth: 9 cm Test dose: negative  Assessment Events: blood not aspirated, injection not painful, no injection resistance, negative IV test and no paresthesia  Additional Notes Patient identified. Risks/Benefits/Options discussed with patient including but not limited to bleeding, infection, nerve damage, paralysis, failed block, incomplete pain control, headache, blood pressure changes, nausea, vomiting, reactions to medication both or allergic, itching and postpartum back pain. Confirmed with bedside nurse the patient's most recent platelet count. Confirmed with patient that they are not currently taking any anticoagulation, have any bleeding history or any family history of bleeding disorders. Patient expressed understanding and wished to proceed. All questions were answered. Sterile technique was used throughout the entire procedure. Please see nursing notes for vital signs. Test dose was given through epidural catheter and negative prior to continuing to dose epidural or start infusion. Warning signs of high block given to the patient including shortness of breath, tingling/numbness in hands, complete motor block, or any concerning symptoms with instructions to call for help. Patient was  given instructions on fall risk and not to get out of bed. All questions and concerns addressed with instructions to call with any issues or inadequate analgesia.

## 2015-10-10 NOTE — Lactation Note (Signed)
This note was copied from the chart of Courtney Nguyen Loll. Lactation Consultation Note  Patient Name: Courtney Nguyen Cerniglia WUJWJ'XToday's Date: 10/10/2015 Reason for consult: Initial assessment First time bf mom that had baby at 35wk. Set up a DEBP, showed manual expression, syring feeding, and gave the supplementing guidelines. Went over PTI behavior and feeding frequency. She does have 22cal formula in the room if she does not have enough milk pumped. She seemed like she was sleepy and may needed more teaching. Given lactation handouts, aware of lactation services, O/ P visits, and support group. Baby does latch easily to both breast in the cross cradle position.   Maternal Data Has patient been taught Hand Expression?: Yes Does the patient have breastfeeding experience prior to this delivery?: No  Feeding Feeding Type: Breast Fed Length of feed: 10 min  LATCH Score/Interventions Latch: Grasps breast easily, tongue down, lips flanged, rhythmical sucking. Intervention(s): Assist with latch  Audible Swallowing: None Intervention(s): Hand expression Intervention(s): Skin to skin  Type of Nipple: Everted at rest and after stimulation  Comfort (Breast/Nipple): Soft / non-tender     Hold (Positioning): Assistance needed to correctly position infant at breast and maintain latch. Intervention(s): Position options  LATCH Score: 7  Lactation Tools Discussed/Used WIC Program: Yes Pump Review: Setup, frequency, and cleaning;Milk Storage Initiated by:: ES Date initiated:: 10/10/15   Consult Status Consult Status: Follow-up Date: 10/11/15 Follow-up type: In-patient    Rulon Eisenmengerlizabeth E Daleiza Bacchi 10/10/2015, 9:16 PM

## 2015-10-10 NOTE — H&P (Signed)
LABOR AND DELIVERY ADMISSION HISTORY AND PHYSICAL NOTE  Courtney Nguyen ("B") is a 24 y.o. female G1P0 with IUP at [redacted]w[redacted]d by U/S on 09/26/15 presents for contractions. Her pregnancy has been complicated by A1GDM diagnosed during the third trimester by GTT. Today she reports the onset of contractions that awoke her from sleep around 7:00 AM this morning. They are are 8/10 severity and increasing in frequency. She also complains of a "small" amount of vaginal bleeding. On further questioning, Courtney Nguyen endorses increased leakage of fluid from her vagina for the past 2 days, or possibly since last tuesday, but denies any gush of fluid. She does not endorse fetal movement today. No fever or chills.  Prenatal History/Complications: A1 GDM She has had inconsistent pre-natal care per her report. Initially seen at Only U/S this pregnancy was anatomy scan on 09/26/15.  Clinic  [facility] Prenatal Labs  Dating  LMP (uncertain) Blood type: O/POS/-- (10/03 1059)   Genetic Screen 1 Screen:    AFP:     Quad:     NIPS: Antibody:NEG (10/03 1059)  Anatomic US  wnl Rubella: 0.30 (10/03 1059)  GTT Third trimester: 1hr 147  3hr 69--> 192-->161-->136 RPR: NON REAC (10/03 1059)   Flu vaccine 09/18/15 HBsAg: NEGATIVE (10/03 1059)   TDaP vaccine 09/18/15                  Rhogam: O pos HIV:   NEG  Baby Food                                               GBS: (For PCN allergy, check sensitivities)  Contraception  Pap: at [facility]  Circumcision  GC/CT- NEG  Pediatrician    Support Person      Past Medical History: Past Medical History  Diagnosis Date  . Gestational diabetes     Past Surgical History: Past Surgical History  Procedure Laterality Date  . No past surgeries      Obstetrical History: OB History    Gravida Para Term Preterm AB TAB SAB Ectopic Multiple Living   1               Social History: Social History   Social History  . Marital Status: Single    Spouse Name: N/A  . Number of Children: N/A  .  Years of Education: N/A   Social History Main Topics  . Smoking status: Never Smoker   . Smokeless tobacco: Never Used  . Alcohol Use: No  . Drug Use: No  . Sexual Activity: No   Other Topics Concern  . None   Social History Narrative    Family History: Family History  Problem Relation Age of Onset  . Asthma Neg Hx   . Diabetes Neg Hx   . Hypertension Neg Hx   . Stroke Neg Hx     Allergies: No Known Allergies  Prescriptions prior to admission  Medication Sig Dispense Refill Last Dose  . ACCU-CHEK FASTCLIX LANCETS MISC 1 each by Percutaneous route 4 (four) times daily. New DX GDM O24.419 for testing 4 times daily 100 each 12 Taking  . glucose blood test strip Test strip compatible with Accu-Check Aviva connect for testing 4 times daily GDM O24.419 100 each 12 Taking  . Prenatal Vit-Fe Fumarate-FA (PRENATAL MULTIVITAMIN) TABS tablet Take 1 tablet by mouth at bedtime.  10/09/2015 at Unknown time     Review of Systems  See HPI  Blood pressure 115/83, pulse 69, temperature 97.8 F (36.6 C), temperature source Oral, resp. rate 19, height  (1.448 m), weight 100 lb 12.8 oz (45.723 kg), last menstrual period 01/22/2015, SpO2 99 %. General appearance: Uncomfortable-appearing woman resting in ED gurney, NAD, husband at bedside. Abdomen: soft, non-tender, fundus firm Pelvic: Cervix 4/90%/0. No membranes palpable. On speculum exam there is pooling and baby's head is visible without membranes noted. Positive for ferning. Extremities: No LE edema or tenderness Presentation: cephalic Fetal monitoring: BHR 135 / moderate/ no accels / no deccels Uterine activity: ctx q2 min Dilation: 4 Effacement (%): 100 Station: 0 Exam by:: Dr Lendell Caprice [name]   Clinic  [facility]  Dating  LMP (uncertain)  Genetic Screen Not done  Anatomic US  wnl  GTT Third trimester: 1hr 147  3hr 69--> 192-->161-->136  Flu vaccine 09/18/15  TDaP vaccine 09/18/15                  Rhogam: O pos, Ab neg   Baby Food  Breast                                Contraception  Undecided  Circumcision  N/A  Pediatrician    Support Person  Husband     Prenatal Labs  Blood type: O/POS/-- (10/03 1059)   Antibody:NEG (10/03 1059)  Rubella: 0.30 (10/03 1059)  RPR: NON REAC (10/03 1059)   HBsAg: NEGATIVE (10/03 1059)   HIV:   NEG  GBS: Unknown  Pap: at [facility]  GC/CT- NEG/NEG  GTT: Third Trimester 1hr = 147   3hr = 69--> 192-->161-->136         Prenatal Transfer Tool  Maternal Diabetes: Yes:  Diabetes Type:  Diet controlled Genetic Screening: Not done Maternal Ultrasounds/Referrals: Anatomy scan 09/26/15 Fetal Ultrasounds or other Referrals:  None Maternal Substance Abuse:  No Significant Maternal Medications:  None Significant Maternal Lab Results: GBS unknown, Rubella NI  Results for orders placed or performed during the hospital encounter of 10/10/15 (from the past 24 hour(s))  Urinalysis, Routine w reflex microscopic (not at [facility])   Collection Time: 10/10/15 10:20 AM  Result Value Ref Range   Color, Urine YELLOW YELLOW   APPearance CLEAR CLEAR   Specific Gravity, Urine 1.010 1.005 - 1.030   pH 7.0 5.0 - 8.0   Glucose, UA NEGATIVE NEGATIVE mg/dL   Hgb urine dipstick TRACE (A) NEGATIVE   Bilirubin Urine NEGATIVE NEGATIVE   Ketones, ur NEGATIVE NEGATIVE mg/dL   Protein, ur NEGATIVE NEGATIVE mg/dL   Urobilinogen, UA 0.2 0.0 - 1.0 mg/dL   Nitrite NEGATIVE NEGATIVE   Leukocytes, UA NEGATIVE NEGATIVE  Urine microscopic-add on   Collection Time: 10/10/15 10:20 AM  Result Value Ref Range   Squamous Epithelial / LPF RARE RARE   RBC / HPF 0-2 <3 RBC/hpf   Bacteria, UA RARE RARE  Crist Fat Test   Collection Time: 10/10/15 12:27 PM  Result Value Ref Range   POCT Fern Test Positive = ruptured amniotic membanes     Patient Active Problem List   Diagnosis Date Noted  . Not immune to rubella 10/10/2015  . Supervision of high risk pregnancy, antepartum 09/25/2015  . Gestational diabetes  mellitus (GDM), antepartum 09/25/2015    Assessment: Courtney Nguyen is a 24 y.o. G1P0 at [redacted]w[redacted]d who presented with contractions and was  admitted for PPROM of 2-7 days. EDD of 11/13/15, noting this EDC is based on a 3rd tri u/s. Currently contracting, in latent labor. -- PPROM confirmed by visual inspection, vaginal pooling, and +ferning -- Category I strip -- Admit to L&D, will manage expectently given painful frequent contractions -- Continue routine fetal monitoring and antepartum care -- GBS Unknown: PCR pending, will start penicillin -- Rubella non-immune, will vaccinate post-artum -- Betamethasone x 1 IM and again in 24 hours if not delivered -- POCT CBGs q4 hrs, increase to q2 hrs once in active labor -- Baby girl expected -- Planning to breast feed -- Undecided RE contraception post-partum -- Diet: NPO for now  Gerri SporeCaitlin Sullivan 10/10/2015, 12:28 PM  OB FELLOW HISTORY AND PHYSICAL ATTESTATION  I have seen and examined this patient; I agree with above documentation in the resident's note.     Cherrie Gauzeoah B Orlyn Odonoghue 10/10/2015, 2:05 PM

## 2015-10-10 NOTE — Anesthesia Preprocedure Evaluation (Signed)
Anesthesia Evaluation  Patient identified by MRN, date of birth, ID band Patient awake    Reviewed: Allergy & Precautions, NPO status , Patient's Chart, lab work & pertinent test results  History of Anesthesia Complications Negative for: history of anesthetic complications  Airway Mallampati: II  TM Distance: >3 FB Neck ROM: Full    Dental no notable dental hx. (+) Dental Advisory Given   Pulmonary neg pulmonary ROS,    Pulmonary exam normal breath sounds clear to auscultation       Cardiovascular negative cardio ROS Normal cardiovascular exam Rhythm:Regular Rate:Normal     Neuro/Psych negative neurological ROS  negative psych ROS   GI/Hepatic negative GI ROS, Neg liver ROS,   Endo/Other  diabetes, Gestational  Renal/GU negative Renal ROS  negative genitourinary   Musculoskeletal negative musculoskeletal ROS (+)   Abdominal   Peds negative pediatric ROS (+)  Hematology negative hematology ROS (+)   Anesthesia Other Findings   Reproductive/Obstetrics (+) Pregnancy                             Anesthesia Physical Anesthesia Plan  ASA: II  Anesthesia Plan: Epidural   Post-op Pain Management:    Induction:   Airway Management Planned:   Additional Equipment:   Intra-op Plan:   Post-operative Plan:   Informed Consent: I have reviewed the patients History and Physical, chart, labs and discussed the procedure including the risks, benefits and alternatives for the proposed anesthesia with the patient or authorized representative who has indicated his/her understanding and acceptance.     Plan Discussed with:   Anesthesia Plan Comments:         Anesthesia Quick Evaluation

## 2015-10-10 NOTE — MAU Note (Addendum)
Started contracting this morning, small amt of bleeding when used restroom here.  Has gest diabetes,  No other problems with preg.  Denies previa or low lying placenta

## 2015-10-11 LAB — RPR: RPR Ser Ql: NONREACTIVE

## 2015-10-11 LAB — GLUCOSE, CAPILLARY
GLUCOSE-CAPILLARY: 96 mg/dL (ref 65–99)
Glucose-Capillary: 107 mg/dL — ABNORMAL HIGH (ref 65–99)
Glucose-Capillary: 133 mg/dL — ABNORMAL HIGH (ref 65–99)
Glucose-Capillary: 105 mg/dL — ABNORMAL HIGH (ref 65–99)

## 2015-10-11 MED ORDER — MEASLES, MUMPS & RUBELLA VAC ~~LOC~~ INJ
0.5000 mL | INJECTION | Freq: Once | SUBCUTANEOUS | Status: AC
  Administered 2015-10-12: 0.5 mL via SUBCUTANEOUS
  Filled 2015-10-11: qty 0.5

## 2015-10-11 NOTE — Anesthesia Postprocedure Evaluation (Signed)
  Anesthesia Post-op Note  Patient: Courtney Nguyen  Procedure(s) Performed: * No procedures listed *  Patient Location: Mother/Baby  Anesthesia Type:Epidural  Level of Consciousness: awake, alert  and oriented  Airway and Oxygen Therapy: Patient Spontanous Breathing  Post-op Pain: none  Post-op Assessment: Post-op Vital signs reviewed, Patient's Cardiovascular Status Stable, Respiratory Function Stable, Patent Airway, No signs of Nausea or vomiting, Adequate PO intake, Pain level controlled and No headache              Post-op Vital Signs: Reviewed and stable  Last Vitals:  Filed Vitals:   10/11/15 0611  BP: 93/56  Pulse: 67  Temp: 36.7 C  Resp: 16    Complications: No apparent anesthesia complications

## 2015-10-11 NOTE — Progress Notes (Signed)
Peter CongoBich Cyndie Chimeguyen is a 24 y.o. G1 now 75P0101 who presented at 1643w1d EGA with PPROM and delivered via NSVD 1 day ago.  Subjective: C/o vaginal pain that she states is tolerable with ibuprofen and ice packs. States that she does not want to use anything stronger for pain mgmt. Reports moderate amount of mildly bloody lochia. Pertinent negatives on ROS include no blurry vision, no headache, no epigastric or RUQ pain, no nausea or vomiting, no fevers. Ambulating, tolerating PO, and voiding without issue.  Objective: Filed Vitals:   10/10/15 2058 10/10/15 2100 10/11/15 0040 10/11/15 0611  BP:  91/51 90/42 93/56   Pulse:  77 69 67  Temp: 98.2 F (36.8 C)  98.1 F (36.7 C) 98 F (36.7 C)  TempSrc: Oral  Oral Oral  Resp:  18  16  Height:      Weight:      SpO2: 100%  99%    CBG (last 3)   Recent Labs  10/10/15 1552 10/10/15 2101 10/11/15 0611  GLUCAP 97 166* 107*   GEN: alert, comfortable-appearing woman resting in hospital bed, holding baby. PULM: CTAB on frontal exam CV: RRR, S1 and S2 heard, no M/R/G appreciated ABD: fundus firm at umbilicus. Abdomen appropriately TTP. No epigastric of RUQ pain. No guarding. EXTR: No LE edema or calf tenderness.   Labs: Group B strep by PCR     Status: None   Collection Time: 10/10/15 11:30 AM  Result Value Ref Range   Group B strep by PCR NEGATIVE NEGATIVE  Type and screen [facility]     Status: None   Collection Time: 10/10/15 12:05 PM  Result Value Ref Range   ABO/RH(D) O POS    Antibody Screen NEG    Sample Expiration 10/13/2015   ABO/Rh     Status: None   Collection Time: 10/10/15 12:05 PM  Result Value Ref Range   ABO/RH(D) O POS   Rapid HIV screen (HIV 1/2 Ab+Ag)     Status: None   Collection Time: 10/10/15 12:05 PM  Result Value Ref Range   HIV-1 P24 Antigen - HIV24 NON REACTIVE NON REACTIVE   HIV 1/2 Antibodies NON REACTIVE NON REACTIVE   Interpretation (HIV Ag Ab)      A non reactive test result means that HIV  1 or HIV 2 antibodies and HIV 1 p24 antigen were not detected in the specimen.  CBC     Status: Abnormal   Collection Time: 10/10/15 12:14 PM  Result Value Ref Range   WBC 14.0 (H) 4.0 - 10.5 K/uL   RBC 4.13 3.87 - 5.11 MIL/uL   Hemoglobin 12.3 12.0 - 15.0 g/dL   HCT 16.137.0 09.636.0 - 04.546.0 %   MCV 89.6 78.0 - 100.0 fL   MCH 29.8 26.0 - 34.0 pg   MCHC 33.2 30.0 - 36.0 g/dL   RDW 40.912.9 81.111.5 - 91.415.5 %   Platelets 224 150 - 400 K/uL  RPR     Status: None   Collection Time: 10/10/15 12:14 PM  Result Value Ref Range   RPR Ser Ql Non Reactive Non Reactive  Fern Test     Status: Normal   Collection Time: 10/10/15 12:27 PM  Result Value Ref Range   POCT Fern Test Positive = ruptured amniotic membanes    Assessment / Plan: Payton MccallumBich Piasecki is a 24 y.o. G1P0101 who is PPD 1 s/p NSVD following PPROM at 2543w1d EGA by u/s 2 weeks ago. Pregnancy course has been complicated by  A1GDM. -- CBGs slightly elevated s/p bethamethasone x 1 yesterday, but not concerning. Will d/c SSI. -- Pain controlled on current regimen -- Hemodynamically stable -- Tolerated regular diet -- Ambulating and voiding without issue -- +flatus -- Planning to breast feed -- Contraception plan: undecided. Patient stated she did not want to [place] any contraception method, or perhaps the calendar/rhythm method. I counseled patient for several minutes regarding risk of pregnancy and options. She states she would like some more time to think about it.  Dispo: Patient for observation at least 1 more midnight.  Gerri Spore 10/11/2015, 7:25 AM

## 2015-10-11 NOTE — Progress Notes (Signed)
CLINICAL SOCIAL WORK MATERNAL/CHILD NOTE  Patient Details  Name: Courtney Nguyen MRN: [id number] Date of Birth: 10/10/2017  Date:  10/11/2015  Clinical Social Worker Initiating Note:  Lucita Ferrara, LCSW and Elissa Hefty, MSW intern  Date/ Time Initiated:  10/11/15/0419     Child's Name:  Courtney Nguyen    Legal Guardian:  Remonia Richter and Andreas Ohm   Need for Interpreter:  None   Date of Referral:  10/10/15     Reason for Referral:  Late or No Prenatal Care    Referral Source:  [facility]   Address:  [address] [place], [place] [address]  Phone number:  [phone]   Household Members:  Self, Parents, Spouse   Natural Supports (not living in the home):  Immediate Family, Spouse/significant other, Parent   Professional Supports: None   Employment: Ship broker, Unemployed   Type of Work:     Education:  Database administrator Resources:  Medicaid   Other Resources:  [facility]   Cultural/Religious Considerations Which May Impact Care:  None Reported   Strengths:  Ability to meet basic needs , Home prepared for child    Risk Factors/Current Problems:  Late prenatal care. MOB only had two prenatal care appointments throughout the pregnancy. MOB reported she had difficulty acquiring her Medicaid and once she did could not get into an OB office because she was "too far along" MOB denied any substance use during the pregnancy.  Infant's UDS is negative and MDS is pending.  Cognitive State:  Linear Thinking , Able to Concentrate , Goal Oriented    Mood/Affect:  Happy , Comfortable , Interested , Bright    CSW Assessment:  MSW intern presented in patient's room due to a consult placed because of late prenatal care. MOB's mother and FOB were present in the room when MSW intern entered the room. MOB provided verbal consent for MSW intern to engage. MOB presented to be in a happy mood as evidence by her openness to answer questions and bonding with the  infant. Per MOB, the birthing process went well and her pain was being monitored. MOB stated her RN has been giving her medications for pain. MOB stated she is currently breastfeeding and formula feeding the infant because her breast milk had not fully came in. MOB shared she wishes to only breastfeed but is okay with supplementing for now for the well-being of the infant. MOB expressed being happy at finally having her infant in her arms and shared she was excited to go home. However, MOB decided to stay an extra two days at the hospital since her infant was born preterm and is a low birth weight. MOB stated she prefers for the infant to still be monitored at the hospital in order to ensure infant's health prior to discharge. MSW intern inquired about MOB's feelings in regard to the preterm labor and infant's weight. MOB denied any feelings of concern and shared that the doctors have informed her the infant is doing well. MOB stated her sister-in-law is purchasing the remainder of the infant's stuff since they were unable to.  MOB voiced being prepared to go home and having met the infant's basic needs. MOB stated she feels well supported by FOB and their family.    MSW intern inquired about MOB's mental health during the pregnancy. MOB stated she had no concerns and had an overall "happy" pregnancy. MOB shared she has diabetes and for that reason had to maintain a  very strict diet during the pregnancy. MSW intern inquired about MOB's feelings in regard to having diabetes during the pregnancy. MOB stated she felt fine and is use to having a limited diet in order to maintain her health. MOB denied any history of anxiety and depression and did not voice any concerns in regard to the topic. MSW intern provided education on perinatal mood disorders. MOB was appreciative of the information and agreed to contact her OB if needs arise.   MSW intern inquired about MOB's late prenatal care. MOB stated her limited  prenatal care was due to delays in being approved for Medicaid. MOB shared that once she was approved for Medicaid she had difficulty finding an OB that would see her because she was "too far along" in her pregnancy. MOB voiced taking over the counter prenatal vitamins and following her diabetic diet. MOB stated she had her two visit at the [facility]. MOB denied any substance use during the pregnancy. MSW intern informed MOB about the hospital's drug screening policy. MSW intern informed MOB that the infant's UDS was negative. Per MOB, the infant had not had a meconium diaper yet. MOB inquired about how she could get her infant's medical records in paper so she could have documentation of the drug screens taken at the hospital. CSW informed MOB about the procedures she needed to take in order to acquire the infant's medical records. MOB denied having any further questions. MOB displayed to be anxious about the news she received from MSW intern. For that reason, MSW intern clarified with MOB why the UDS was collected and went over the hospital's drug screening policies again in order to help relieve some of MOB's stress and anxiety.MOB was appreciative for the clarity and information provided.   MOB denied having any further questions but agreed to contact MSW intern is needs arise.   CSW Plan/Description:   1)Patient/Family Education-MSW intern provided education on perinatal mood disorders and the hospital's drug screening policy.  2) CSW to monitor infant's toxicology screens, and will make a CPS report if positive. 3) No Further Intervention Required/No Barriers to Discharge    Trevor Iha, Student-SW 10/11/2015, 4:22 PM    Original assessment documented by MSW Intern, Trevor Iha.  Included are my edits as necessary.  Lucita Ferrara MSW, LCSW

## 2015-10-11 NOTE — Lactation Note (Signed)
This note was copied from the chart of Courtney Courtney Nguyen. Lactation Consultation Note  Patient Name: Courtney Nguyen WJXBJ'YToday's Date: 10/11/2015 Reason for consult: Follow-up assessment;Infant < 6lbs;Late preterm infant   Follow up consult with mom of 22 hour old infant. Infant LPT and weighs 4 pounds 14 ounces today. Mom with small breasts and everted nipples. Breasts are soft and compressible, mom reports no changes noted to breast today. Mom has  DEBP at bedside, she reports she has not pumped today and said "I have no milk" Discussed supply and demand and importance of BF baby and pumping to assist in initiation of lactation. Infant had had 2 BF, 2 attempts, 6 formula feedings via bottle and syringe for 5-15cc each, 2 voids, and 0 stools since birth. Infant was in a deep sleep on dad's chest. Discussed with mom LPT Infant plan on handout and need to awaken infant to feed every 3 hours. Mom voiced understanding. Mom reports that it is usually painful when the infant latches. Assisted mom in latching infant. Infant latched easily with rhythmic sucking, few intermittent swallows noted. Infant came on and off breast, mom needed assistance with getting a deep latch, she reports that she did not experience pain with feeding. Football abd cross cradle holds used, mom needed assistance with latching infant. Visitors came to room and mom pulled infant off breast. Mom the bottle fed infant formula. Discussed LPT infant supplementation amounts that need to be increased per chart as infant will be turning 24 hours old, mom voiced understanding. Will follow up tomorrow.     Maternal Data Formula Feeding for Exclusion: No Does the patient have breastfeeding experience prior to this delivery?: No  Feeding Feeding Type: Breast Fed Length of feed: 10 min  LATCH Score/Interventions Latch: Grasps breast easily, tongue down, lips flanged, rhythmical sucking. Intervention(s): Adjust position;Assist with  latch;Breast massage  Audible Swallowing: A few with stimulation Intervention(s): Skin to skin  Type of Nipple: Everted at rest and after stimulation  Comfort (Breast/Nipple): Filling, red/small blisters or bruises, mild/mod discomfort  Problem noted: Mild/Moderate discomfort Interventions (Mild/moderate discomfort):  (assisted with deeper latch)  Hold (Positioning): Assistance needed to correctly position infant at breast and maintain latch. Intervention(s): Breastfeeding basics reviewed;Support Pillows;Position options;Skin to skin  LATCH Score: 7  Lactation Tools Discussed/Used     Consult Status Consult Status: Follow-up Date: 10/12/15 Follow-up type: In-patient    Silas FloodSharon S Hice 10/11/2015, 3:38 PM

## 2015-10-11 NOTE — Progress Notes (Signed)
CBG at 1825 was 133, but patient ate at 1630.  Told to call for meal time CBG to be checked before eating.  Will continue to monitor.  Vivi MartensAshley Adayah Arocho RN

## 2015-10-12 ENCOUNTER — Ambulatory Visit: Payer: Self-pay

## 2015-10-12 LAB — GLUCOSE, CAPILLARY: Glucose-Capillary: 82 mg/dL (ref 65–99)

## 2015-10-12 MED ORDER — IBUPROFEN 600 MG PO TABS: 600.0000 mg | ORAL_TABLET | Freq: Four times a day (QID) | ORAL | Status: DC

## 2015-10-12 NOTE — Lactation Note (Signed)
This note was copied from the chart of Courtney Payton MccallumBich Doolin. Lactation Consultation Note  Patient Name: Courtney Nguyen: 10/12/2015  Baby was on phototherapy sleeping. Mom reports baby needs to eat again in 1.5hr. Mom does have a compression stripe on the L nipple, R nipple looks normal. Suggested that she use the "c" hold/squish the breast to get more nipple in the baby's mouth. She could call for help with latch. Encouraged mom to go ahead and pump now so that she will have some of her milk to give the baby after latching. Went over breast changes and nipple care. Mom will call with questions.    Maternal Data    Feeding Feeding Type: Bottle Fed - Formula Nipple Type: Slow - flow Length of feed: 10 min  LATCH Score/Interventions                      Lactation Tools Discussed/Used     Consult Status      Courtney Nguyen 10/12/2015, 5:03 PM

## 2015-10-12 NOTE — Discharge Summary (Signed)
OB Discharge Summary  Patient Name: Courtney Nguyen DOB: March 31, 1991 MRN: [id number]  Date of admission: 10/10/2015 Delivering MD: Laurey Arrow BEDFORD   Date of discharge: 10/12/2015  Admitting diagnosis: 62WKS,CTX, PPROM Intrauterine pregnancy: [redacted]w[redacted]d    Secondary diagnosis: Gestational Diabetes diet controlled (A1)     Discharge diagnosis: Preterm Pregnancy Delivered                                                                                                Post partum procedures:None  Augmentation: None  Complications: None  Principal Problem:   Preterm premature rupture of membranes (PPROM) with unknown onset of labor Active Problems:   Supervision of high risk pregnancy, antepartum   Gestational diabetes mellitus (GDM), antepartum   Not immune to rubella   NSVD (normal spontaneous vaginal delivery)  Hospital course:  Onset of Labor With Vaginal Delivery     24y.o. yo G1P0101 at 320w1das admitted in Latent Labor on 10/10/2015. Patient had an uncomplicated labor course as follows:  Membrane Rupture Time/Date: 10:00 AM ,10/02/2015   Intrapartum Procedures: Episiotomy: None [1]                                         Lacerations:  2nd degree [3];Perineal [11]  Patient had a delivery of a Viable infant. 10/10/2015  Information for the patient's newborn:  [name], Kulesza0[[id number]]Delivery Method: Vaginal, Spontaneous Delivery (Filed from Delivery Summary)     Pateint had an uncomplicated postpartum course.  She is ambulating, tolerating a regular diet, passing flatus, and urinating well. Patient is discharged home in stable condition on No discharge date for patient encounter.. Marland Kitchen  Physical exam  Filed Vitals:   10/11/15 0040 10/11/15 0611 10/11/15 1811 10/12/15 0603  BP: 90/42 93/56 108/52 92/48  Pulse: 69 67 83 65  Temp: 98.1 F (36.7 C) 98 F (36.7 C) 97.9 F (36.6 C) 98 F (36.7 C)  TempSrc: Oral Oral Oral Oral  Resp:  16 18 16   Height:       Weight:      SpO2: 99%      General: alert, cooperative and no distress Lochia: appropriate Uterine Fundus: firm Incision: N/A DVT Evaluation: No evidence of DVT seen on physical exam. Labs: Lab Results  Component Value Date   WBC 14.0* 10/10/2015   HGB 12.3 10/10/2015   HCT 37.0 10/10/2015   MCV 89.6 10/10/2015   PLT 224 10/10/2015   No flowsheet data found.  Discharge instruction: per After Visit Summary and "Baby and Me Booklet".  Medications:  Current facility-administered medications:  .  acetaminophen (TYLENOL) tablet 650 mg, 650 mg, Oral, Q4H PRN, CaKeane ScrapeMD .  benzocaine-Menthol (DERMOPLAST) 20-0.5 % topical spray 1 application, 1 application, Topical, PRN, CaKeane ScrapeMD .  witch hazel-glycerin (TUCKS) pad 1 application, 1 application, Topical, PRN **AND** dibucaine (NUPERCAINAL) 1 % rectal ointment 1 application, 1 application, Rectal, PRN, CaKeane ScrapeMD .  diphenhydrAMINE (BENADRYL) capsule  25 mg, 25 mg, Oral, Q6H PRN, Keane Scrape, MD .  docusate sodium (COLACE) capsule 100 mg, 100 mg, Oral, BID, Keane Scrape, MD, 100 mg at 10/11/15 2204 .  ibuprofen (ADVIL,MOTRIN) tablet 600 mg, 600 mg, Oral, 4 times per day, Keane Scrape, MD, 600 mg at 10/12/15 0604 .  Influenza vac split quadrivalent PF (FLUARIX) injection 0.5 mL, 0.5 mL, Intramuscular, Tomorrow-1000, Peggy Constant, MD, 0.5 mL at 10/11/15 1000 .  lanolin ointment, , Topical, PRN, Keane Scrape, MD .  measles, mumps and rubella vaccine (MMR) injection 0.5 mL, 0.5 mL, Subcutaneous, Once, Keane Scrape, MD .  ondansetron [facility]) tablet 4 mg, 4 mg, Oral, Q4H PRN **OR** ondansetron (ZOFRAN) injection 4 mg, 4 mg, Intravenous, Q4H PRN, Keane Scrape, MD .  oxyCODONE-acetaminophen (PERCOCET/ROXICET) 5-325 MG per tablet 1 tablet, 1 tablet, Oral, Q4H PRN, Keane Scrape, MD .  prenatal multivitamin tablet 1 tablet, 1 tablet, Oral, Q1200, Keane Scrape, MD, 1 tablet at  10/11/15 1850 .  senna-docusate (Senokot-S) tablet 2 tablet, 2 tablet, Oral, Q24H, Keane Scrape, MD, 2 tablet at 10/12/15 0038 .  simethicone (MYLICON) chewable tablet 80 mg, 80 mg, Oral, PRN, Keane Scrape, MD .  Tdap (BOOSTRIX) injection 0.5 mL, 0.5 mL, Intramuscular, Once, Keane Scrape, MD, 0.5 mL at 10/11/15 1000 .  zolpidem (AMBIEN) tablet 5 mg, 5 mg, Oral, QHS PRN, Keane Scrape, MD  Facility-Administered Medications Ordered in Other Encounters:  .  lidocaine (PF) (XYLOCAINE) 1 % injection, , , Anesthesia Intra-op, Lauretta Grill, MD, 4 mL at 10/10/15 1324  Diet: routine diet  Activity: Advance as tolerated. Pelvic rest for 6 weeks.   Outpatient follow up:6 weeks  Future Appointments Date Time Provider [place]  10/30/2015 7:30 AM [place] 5 WH-US 203  11/24/2015 8:30 AM Mora Bellman, MD [facility]   Postpartum contraception: Natural Family Planning (Calendar Method)  Newborn Data: Live born female  Birth Weight: 4 lb 15.4 oz (2250 g) APGAR: 8, 9  Baby Feeding: Breast Disposition:baby undergoing phototherapy; mother will room in if baby not discharged today  10/12/2015 Darci Needle, MD  [place], PGY-1  CNM attestation I have seen and examined this patient and agree with above documentation in the resident's note.   Courtney Nguyen is a 24 y.o. G1P0101 s/p SVD.   Pain is well controlled.  Plan for birth control is natural family planning (NFP).  Method of Feeding: breast  PE:  BP 92/48 mmHg  Pulse 65  Temp(Src) 98 F (36.7 C) (Oral)  Resp 16  Ht 4' 9"  (1.448 m)  Wt 45.723 kg (100 lb 12.8 oz)  BMI 21.81 kg/m2  SpO2 99%  LMP 01/22/2015  Breastfeeding? Unknown Fundus firm  No results for input(s): HGB, HCT in the last 72 hours.   Plan: discharge today - postpartum care discussed - f/u clinic in 6 weeks for postpartum visit   Serita Grammes, CNM 3:01 PM  10/13/2015

## 2015-10-12 NOTE — Discharge Instructions (Signed)
Ms. Leece, congratulations on delivering your baby!  We recommend vaginal rest until your 6-week follow-up appointment, meaning nothing in the vagina (sexual intercourse, tampons). If you develop a fever (temperature of 100.4 F or greater), have heavy vaginal bleeding, or have severe abdominal tenderness, please seek medical care before your 6-week appointment.   Postpartum Care After Vaginal Delivery After you deliver your newborn (postpartum period), the usual stay in the hospital is 24-72 hours. If there were problems with your labor or delivery, or if you have other medical problems, you might be in the hospital longer.  While you are in the hospital, you will receive help and instructions on how to care for yourself and your newborn during the postpartum period.  While you are in the hospital:  Be sure to tell your nurses if you have pain or discomfort, as well as where you feel the pain and what makes the pain worse.  If you had an incision made near your vagina (episiotomy) or if you had some tearing during delivery, the nurses may put ice packs on your episiotomy or tear. The ice packs may help to reduce the pain and swelling.  If you are breastfeeding, you may feel uncomfortable contractions of your uterus for a couple of weeks. This is normal. The contractions help your uterus get back to normal size.  It is normal to have some bleeding after delivery.  For the first 1-3 days after delivery, the flow is red and the amount may be similar to a period.  It is common for the flow to start and stop.  In the first few days, you may pass some small clots. Let your nurses know if you begin to pass large clots or your flow increases.  Do not  flush blood clots down the toilet before having the nurse look at them.  During the next 3-10 days after delivery, your flow should become more watery and pink or brown-tinged in color.  Ten to fourteen days after delivery, your flow should be a  small amount of yellowish-white discharge.  The amount of your flow will decrease over the first few weeks after delivery. Your flow may stop in 6-8 weeks. Most women have had their flow stop by 12 weeks after delivery.  You should change your sanitary pads frequently.  Wash your hands thoroughly with soap and water for at least 20 seconds after changing pads, using the toilet, or before holding or feeding your newborn.  You should feel like you need to empty your bladder within the first 6-8 hours after delivery.  In case you become weak, lightheaded, or faint, call your nurse before you get out of bed for the first time and before you take a shower for the first time.  Within the first few days after delivery, your breasts may begin to feel tender and full. This is called engorgement. Breast tenderness usually goes away within 48-72 hours after engorgement occurs. You may also notice milk leaking from your breasts. If you are not breastfeeding, do not stimulate your breasts. Breast stimulation can make your breasts produce more milk.  Spending as much time as possible with your newborn is very important. During this time, you and your newborn can feel close and get to know each other. Having your newborn stay in your room (rooming in) will help to strengthen the bond with your newborn. It will give you time to get to know your newborn and become comfortable caring for your newborn.  Your hormones change after delivery. Sometimes the hormone changes can temporarily cause you to feel sad or tearful. These feelings should not last more than a few days. If these feelings last longer than that, you should talk to your caregiver.  If desired, talk to your caregiver about methods of family planning or contraception.  Talk to your caregiver about immunizations. Your caregiver may want you to have the following immunizations before leaving the hospital:  Tetanus, diphtheria, and pertussis (Tdap) or  tetanus and diphtheria (Td) immunization. It is very important that you and your family (including grandparents) or others caring for your newborn are up-to-date with the Tdap or Td immunizations. The Tdap or Td immunization can help protect your newborn from getting ill.  Rubella immunization.  Varicella (chickenpox) immunization.  Influenza immunization. You should receive this annual immunization if you did not receive the immunization during your pregnancy.   This information is not intended to replace advice given to you by your health care provider. Make sure you discuss any questions you have with your health care provider.   Document Released: 10/06/2007 Document Revised: 09/02/2012 Document Reviewed: 08/05/2012 Elsevier Interactive Patient Education [employer]2016 Elsevier Inc.

## 2015-10-13 ENCOUNTER — Ambulatory Visit: Payer: Self-pay

## 2015-10-13 NOTE — Lactation Note (Signed)
This note was copied from the chart of Courtney Nguyen. Lactation Consultation Note  Patient Name: Courtney Payton MccallumBich Repinski VWUJW'JToday's Date: 10/13/2015 Reason for consult: Follow-up assessment;Late preterm infant;Infant < 6lbs;Hyperbilirubinemia  LPI 64 hours old. Mom reports that her breasts are feeling "tight." Discussed engorgement prevention/treatment. Enc mom to put baby to breast first with each feeding, then supplement with EBM/formula according to supplementation guidelines, and then post-pump for 15 minutes followed by hand expression. Demonstrated hand expression to mom with colostrum present. Mom pumped and obtained 3 mls from left breast. Assisted mom to latch baby to right breast in football hold, but then moved baby to cross-cradle and baby latched deeply, suckling rhythmically with a few swallows noted. After 15 minutes of continuous BF, assisted mom to supplement the 3mls of EBM. Got mom started with supplementing formula as well. Mom states that baby tolerated 30 mls at last feeding 3 hours earlier. Enc mom to continue to nurse and supplement at least every 3 hours and with cues. Discussed paced feeding, and limiting total feeding time to 30 minutes.   Fitted mom with #21 flanges. Mom states that she has a personal pump at home that a family member purchased for her. Mom states that she has been active with WIC, and [facility] LC [facility]. [facility] and states that she plans to call. Discussed engorgement prevention/treatment, and mom aware of OP/BFSG and LC phone line assistance after D/C.  Maternal Data    Feeding Feeding Type: Breast Fed Nipple Type: Slow - flow Length of feed: 15 min  LATCH Score/Interventions Latch: Grasps breast easily, tongue down, lips flanged, rhythmical sucking.  Audible Swallowing: A few with stimulation Intervention(s): Hand expression  Type of Nipple: Everted at rest and after  stimulation  Comfort (Breast/Nipple): Filling, red/small blisters or bruises, mild/mod discomfort  Problem noted: Filling;Mild/Moderate discomfort Interventions (Filling): Double electric pump Interventions (Mild/moderate discomfort): Hand expression  Hold (Positioning): Assistance needed to correctly position infant at breast and maintain latch. Intervention(s): Breastfeeding basics reviewed;Support Pillows;Position options;Skin to skin  LATCH Score: 7  Lactation Tools Discussed/Used     Consult Status Consult Status: Follow-up Date: 10/14/15 Follow-up type: In-patient    Geralynn OchsWILLIARD, Donnelle Rubey 10/13/2015, 9:23 AM

## 2015-10-14 ENCOUNTER — Ambulatory Visit: Payer: Self-pay

## 2015-10-14 NOTE — Lactation Note (Signed)
This note was copied from the chart of Courtney Payton MccallumBich Yuhasz. Lactation Consultation Note  Patient Name: Courtney Payton MccallumBich Dray [id number]'s Date: 10/14/2015 Reason for consult: Follow-up assessment;Hyperbilirubinemia;Late preterm infant;Infant < 6lbs LPI 88 hours old. Baby's bilirubin is lower today and mom states that she just gave baby 32 mls of formula. Mom declined assistance with latching baby to breast and states that her breasts are comfortable today. Mom reports that when she pumps, the amount of EBM is increasing. Mom states that she has a DEBP at home, and she is aware of OP/BFSG and LC phone line assistance after D/C.  Maternal Data    Feeding Feeding Type: Breast Milk with Formula added Nipple Type: Slow - flow  LATCH Score/Interventions                      Lactation Tools Discussed/Used     Consult Status Consult Status: Complete    Courtney Nguyen, Kerilyn Cortner 10/14/2015, 9:38 AM

## 2015-10-15 ENCOUNTER — Ambulatory Visit: Payer: Self-pay

## 2015-10-15 NOTE — Lactation Note (Signed)
This note was copied from the chart of Courtney Nguyen. Lactation Consultation Note  Patient Name: Courtney Payton MccallumBich Grobe UEAVW'UToday's Date: 10/15/2015 Reason for consult: Follow-up assessment;Hyperbilirubinemia;Late preterm infant;Infant < 286lbs  Baby 25 days old. Baby is at 0% weight loss and bilirubin levels wnl. Parents and baby sleeping. Mom states that baby is doing well, and she is pumping "some." Offered to assist mom with latching baby, but mom declined. Mom aware of OP/BFSG and LC phone line assistance after D/C.  Maternal Data    Feeding    LATCH Score/Interventions                      Lactation Tools Discussed/Used     Consult Status Consult Status: PRN    Courtney Nguyen, Courtney Nguyen 10/15/2015, 9:01 AM

## 2015-10-16 ENCOUNTER — Encounter: Payer: Self-pay | Admitting: *Deleted

## 2015-10-16 ENCOUNTER — Encounter: Payer: Self-pay | Admitting: Obstetrics & Gynecology

## 2015-10-30 ENCOUNTER — Ambulatory Visit (HOSPITAL_COMMUNITY): Payer: Medicaid Other | Attending: Advanced Practice Midwife

## 2015-11-24 ENCOUNTER — Ambulatory Visit (INDEPENDENT_AMBULATORY_CARE_PROVIDER_SITE_OTHER): Payer: Medicaid Other | Admitting: Obstetrics and Gynecology

## 2015-11-24 ENCOUNTER — Encounter: Payer: Self-pay | Admitting: Obstetrics and Gynecology

## 2015-11-24 NOTE — Progress Notes (Signed)
  Subjective:     Courtney Nguyen is a 24 y.o. female who presents for a postpartum visit. She is 6 weeks postpartum following a spontaneous vaginal delivery. I have fully reviewed the prenatal and intrapartum course. The delivery was at 35.1 gestational weeks. Outcome: spontaneous vaginal delivery. Anesthesia: epidural. Postpartum course has been uncomplicated. Baby's course has been uncomplicated. Baby is feeding by both breast and bottle - Enfamil AR. Bleeding onset of period a few days ago. Bowel function is normal. Bladder function is normal. Patient is not sexually active. Contraception method is abstinence. Postpartum depression screening: negative.     Review of Systems Pertinent items are noted in HPI.   Objective:    BP 102/46 mmHg  Pulse 57  Temp(Src) 97.9 F (36.6 C)  Wt 87 lb (39.463 kg)  General:  alert, cooperative and no distress   Breasts:  inspection negative, no nipple discharge or bleeding, no masses or nodularity palpable  Lungs: clear to auscultation bilaterally  Heart:  regular rate and rhythm  Abdomen: soft, non-tender; bowel sounds normal; no masses,  no organomegaly   Vulva:  normal  Vagina: normal vagina, no discharge, exudate, lesion, or erythema  Cervix:  multiparous appearance  Corpus: normal size, contour, position, consistency, mobility, non-tender  Adnexa:  normal adnexa and no mass, fullness, tenderness  Rectal Exam: Not performed        Assessment:     Normal postpartum exam. Pap smear not done at today's visit.   Plan:    1. Contraception: rhythm method 2. Patient is medically cleared to resume all activities of daily living 3. Follow up in 1 week for 2 hour glucola and with health department for annual exam or as needed.

## 2015-11-24 NOTE — Progress Notes (Deleted)
Patient ID: Payton MccallumBich Wendt, female   DOB: 02/11/1991, 24 y.o.   MRN: [id number] Postpartum Visit Patient is here for a postpartum visit. She is {0-10:33138} weeks postpartum following a {delivery:12449}. I have fully reviewed the prenatal and intrapartum course. The delivery was at *** gestational weeks. Outcome: {delivery outcome:32078}. Anesthesia: {procedures; anesthesia:812}.  Postpartum course has been ***. Baby's course has been ***. Baby is feeding by {breast/bottle:69}. Bleeding {vag bleed:12292}. Bowel function is {normal:32111}. Bladder function is {normal:32111}. Patient {is/is not:9024} sexually active. Contraception method is {contraceptive method:5051}. Postpartum depression screening: {neg default:13464::"negative"}.

## 2015-12-01 ENCOUNTER — Other Ambulatory Visit: Payer: Medicaid Other

## 2015-12-01 DIAGNOSIS — O99814 Abnormal glucose complicating childbirth: Secondary | ICD-10-CM

## 2015-12-02 ENCOUNTER — Encounter: Payer: Self-pay | Admitting: Obstetrics & Gynecology

## 2015-12-02 LAB — GLUCOSE TOLERANCE, 2 HOURS
GLUCOSE, FASTING: 92 mg/dL (ref 65–99)
Glucose, 2 hour: 94 mg/dL (ref 70–139)

## 2015-12-04 ENCOUNTER — Encounter: Payer: Self-pay | Admitting: *Deleted

## 2016-10-17 LAB — OB RESULTS CONSOLE ANTIBODY SCREEN: Antibody Screen: NEGATIVE

## 2016-10-17 LAB — OB RESULTS CONSOLE VARICELLA ZOSTER ANTIBODY, IGG: VARICELLA IGG: NON-IMMUNE/NOT IMMUNE

## 2016-10-17 LAB — OB RESULTS CONSOLE ABO/RH: RH TYPE: POSITIVE

## 2016-10-17 LAB — OB RESULTS CONSOLE HIV ANTIBODY (ROUTINE TESTING): HIV: NONREACTIVE

## 2016-10-17 LAB — OB RESULTS CONSOLE HGB/HCT, BLOOD
HCT: 34 %
Hemoglobin: 11.2 g/dL

## 2016-10-17 LAB — OB RESULTS CONSOLE RPR: RPR: NONREACTIVE

## 2016-10-17 LAB — OB RESULTS CONSOLE GC/CHLAMYDIA
Chlamydia: NEGATIVE
GC PROBE AMP, GENITAL: NEGATIVE

## 2016-10-17 LAB — OB RESULTS CONSOLE PLATELET COUNT: Platelets: 267 10*3/uL

## 2016-10-17 LAB — OB RESULTS CONSOLE RUBELLA ANTIBODY, IGM: RUBELLA: IMMUNE

## 2016-10-17 LAB — CYTOLOGY - PAP: PAP SMEAR: NEGATIVE

## 2016-10-17 LAB — CULTURE, OB URINE: Urine Culture, OB: NEGATIVE

## 2016-10-17 LAB — OB RESULTS CONSOLE HEPATITIS B SURFACE ANTIGEN: Hepatitis B Surface Ag: NEGATIVE

## 2016-10-17 LAB — GLUCOSE, 1 HOUR: GLUCOSE 1 HOUR: 167

## 2016-10-21 LAB — GLUCOSE, 3 HOUR

## 2016-10-28 ENCOUNTER — Encounter: Payer: Self-pay | Admitting: *Deleted

## 2016-10-28 ENCOUNTER — Ambulatory Visit: Payer: Medicaid Other | Admitting: *Deleted

## 2016-10-28 ENCOUNTER — Other Ambulatory Visit: Payer: Self-pay

## 2016-10-28 DIAGNOSIS — Z8632 Personal history of gestational diabetes: Secondary | ICD-10-CM

## 2016-10-28 MED ORDER — GLUCOSE BLOOD VI STRP: ORAL_STRIP | 12 refills | Status: DC

## 2016-10-28 MED ORDER — ACCU-CHEK AVIVA PLUS W/DEVICE KIT: 1.0000 | PACK | Freq: Once | 0 refills | Status: AC

## 2016-10-28 MED ORDER — ACCU-CHEK FASTCLIX LANCETS MISC: 1.0000 | Freq: Four times a day (QID) | 12 refills | Status: DC

## 2016-10-28 NOTE — Patient Instructions (Signed)
1. limit her rice at breakfast to no more than 1 cup, and the same at supper. 2.  Increase 3PM snack to have protein with 1-2 starches with soup.  Suggestions given for this. 3. HS snack to have 30 grams of carbs with 1 ounce of protein.  Suggestions given for this as well 4. Stop the sweetened creamer in the morning coffee. 5. Test blood sugar before breakfast, and 2 hours after meals and at bedtime.

## 2016-10-28 NOTE — Progress Notes (Signed)
Patient reports that she is 4 months pregnant, and had a glucose tolerance test at the health dept. She said that the fasting reading was over 200.  She did not remember any of the other readings.   Current diet:  Typical day: 8AM: coffee with sweetened creamer. 11AM:  1 1/2 cup white rice, with vegetables, and meat, and soup.  Water to drink 3PM: snack of piece of fruit, or trail mix type snack package, or crackers. With water to drink 7-8PM: supper: 2-21/2 cups of white rice, with meat or fish, and veg., and soup.    Discussed the importance of limiting the amount of rice at her 2 main meals.  Suggested she eat 3 smaller meals and 2-3 snacks, but she was not receptive to this.  She was told to: 1. limit her rice at breakfast to no more than 1 cup, and the same at supper. 2.  Increase 3PM snack to have protein with 1-2 starches with soup.  Suggestions given for this. 3. HS snack to have 30 grams of carbs with 1 ounce of protein.  Suggestions given for this as well 4. Stop the sweetened creamer in the morning coffee. 5. Test blood sugar before breakfast, and 2 hours after meals and at bedtime.      She said that she did not need information on gestational diabetes, because she had this with her first pregnancy.   Note to order Accu-Chek meter and test strips given to nurse.  And patient told to pick them up at the pharmacy later today.  She agreed to do this. We reviewed how/when to test blood sugar and she  reported good understanding of this.

## 2016-11-04 ENCOUNTER — Ambulatory Visit (INDEPENDENT_AMBULATORY_CARE_PROVIDER_SITE_OTHER): Payer: Medicaid Other | Admitting: Obstetrics and Gynecology

## 2016-11-04 ENCOUNTER — Encounter: Payer: Self-pay | Admitting: Obstetrics and Gynecology

## 2016-11-04 VITALS — BP 108/58 | HR 75 | Wt 98.5 lb

## 2016-11-04 DIAGNOSIS — O0992 Supervision of high risk pregnancy, unspecified, second trimester: Secondary | ICD-10-CM | POA: Insufficient documentation

## 2016-11-04 DIAGNOSIS — O09299 Supervision of pregnancy with other poor reproductive or obstetric history, unspecified trimester: Secondary | ICD-10-CM | POA: Insufficient documentation

## 2016-11-04 DIAGNOSIS — O24410 Gestational diabetes mellitus in pregnancy, diet controlled: Secondary | ICD-10-CM

## 2016-11-04 DIAGNOSIS — R87610 Atypical squamous cells of undetermined significance on cytologic smear of cervix (ASC-US): Secondary | ICD-10-CM | POA: Insufficient documentation

## 2016-11-04 DIAGNOSIS — O09292 Supervision of pregnancy with other poor reproductive or obstetric history, second trimester: Secondary | ICD-10-CM

## 2016-11-04 DIAGNOSIS — R87810 Cervical high risk human papillomavirus (HPV) DNA test positive: Secondary | ICD-10-CM

## 2016-11-04 DIAGNOSIS — O09892 Supervision of other high risk pregnancies, second trimester: Secondary | ICD-10-CM | POA: Insufficient documentation

## 2016-11-04 LAB — POCT URINALYSIS DIP (DEVICE)
Bilirubin Urine: NEGATIVE
Glucose, UA: NEGATIVE mg/dL
HGB URINE DIPSTICK: NEGATIVE
Ketones, ur: NEGATIVE mg/dL
NITRITE: NEGATIVE
PH: 7 (ref 5.0–8.0)
Protein, ur: NEGATIVE mg/dL
Specific Gravity, Urine: 1.01 (ref 1.005–1.030)
UROBILINOGEN UA: 0.2 mg/dL (ref 0.0–1.0)

## 2016-11-04 LAB — COMPREHENSIVE METABOLIC PANEL
ALT: 10 U/L (ref 6–29)
AST: 15 U/L (ref 10–30)
Albumin: 3.9 g/dL (ref 3.6–5.1)
Alkaline Phosphatase: 39 U/L (ref 33–115)
BUN: 9 mg/dL (ref 7–25)
CHLORIDE: 103 mmol/L (ref 98–110)
CO2: 26 mmol/L (ref 20–31)
CREATININE: 0.38 mg/dL — AB (ref 0.50–1.10)
Calcium: 9.1 mg/dL (ref 8.6–10.2)
GLUCOSE: 71 mg/dL (ref 65–99)
POTASSIUM: 4.4 mmol/L (ref 3.5–5.3)
SODIUM: 136 mmol/L (ref 135–146)
Total Bilirubin: 0.3 mg/dL (ref 0.2–1.2)
Total Protein: 7 g/dL (ref 6.1–8.1)

## 2016-11-04 LAB — HEMOGLOBIN A1C
HEMOGLOBIN A1C: 5.2 % (ref <5.7)
MEAN PLASMA GLUCOSE: 103 mg/dL

## 2016-11-04 MED ORDER — ASPIRIN EC 81 MG PO TBEC: 81.0000 mg | DELAYED_RELEASE_TABLET | Freq: Every day | ORAL | 3 refills | Status: DC

## 2016-11-04 NOTE — Progress Notes (Signed)
New OB Note  11/04/2016   Clinic: [facility]  Chief Complaint: NOB  Transfer of Care Patient: Yes, [facility]  History of Present Illness: Courtney Nguyen is a 25 y.o. G2P0101 @ 18/5 weeks ([place] 4/11, based on LMP=16wk u/s), with the above CC. Preg complicated by has GDM (1st trimester dx); History of preterm premature rupture of membranes (PROM) in previous pregnancy, currently pregnant in second trimester; Short interval between pregnancies affecting pregnancy in second trimester, antepartum; and ASCUS with positive high risk HPV cervical on her problem list.  Her periods were: regular, qmonth She was using no method when she conceived.  She has Negative signs or symptoms of nausea/vomiting of pregnancy. She has Negative signs or symptoms of miscarriage or preterm labor On any different medications around the time she conceived/early pregnancy: No   ROS: A 12-point review of systems was performed and negative, except as stated in the above HPI.  OBGYN History: As per HPI. OB History  Gravida Para Term Preterm AB Living  2 1   1   1   SAB TAB Ectopic Multiple Live Births        0 1    # Outcome Date GA Lbr Len/2nd Weight Sex Delivery Anes PTL Lv  2 Current           1 Preterm 10/10/15 [id number]:31 / 01:36 4 lb 15.4 oz (2.25 kg) F Vag-Spont EPI  LIV      Any issues with any prior pregnancies: 35/1wk PPROM and SVD, GDMa1 Any prior children are healthy, doing well, without any problems or issues: yes History of pap smears: Yes. Last pap smear 2016 and it was ascus/hpv pos. History of STIs: No   Past Medical History: Past Medical History:  Diagnosis Date  . Gestational diabetes   . History of gestational diabetes mellitus 09/25/2015   Normal 2 hr GTT on 12/02/15    . Intrauterine pregnancy   . Preterm labor     Past Surgical History: Past Surgical History:  Procedure Laterality Date  . NO PAST SURGERIES      Family History:  Family History  Problem Relation  Age of Onset  . Asthma Neg Hx   . Diabetes Neg Hx   . Hypertension Neg Hx   . Stroke Neg Hx    She denies any history of mental retardation, birth defects or genetic disorders in her or the FOB's history; this FOB is same as prior child  Social History:  Social History   Social History  . Marital status: Married    Spouse name: N/A  . Number of children: 0  . Years of education: N/A   Occupational History  . Not on file.   Social History Main Topics  . Smoking status: Never Smoker  . Smokeless tobacco: Never Used  . Alcohol use No  . Drug use: No  . Sexual activity: No   Other Topics Concern  . Not on file   Social History Narrative  . No narrative on file    Allergy: No Known Allergies  Health Maintenance:  Mammogram Up to Date: not applicable  Current Outpatient Medications: PNV  Physical Exam:   BP (!) 108/58   Pulse 75   Wt 98 lb 8 oz (44.7 kg)   LMP  (LMP Unknown)   BMI 19.24 kg/m  Body mass index is 19.24 kg/m. Fundal height: not applicable FHTs: 1315V General appearance: Well nourished, well developed female in no acute distress.  Laboratory: O pos/RI/VNI/rpr neg/hiv neg/hepB neg/hemoglobinopathy neg/CF neg/UDS neg/GC-CG neg/CBC neg  Imaging:  16wk u/s at [facility] with SLIP c/w LMP. cx length (transabdominal) >4cm  Assessment: pt doing well  Plan: 1. ASCUS with positive high risk HPV cervical I didn't notice this until after she had left but she needs a pap smear next visit.   2. History of preterm premature rupture of membranes (PROM) in previous pregnancy, currently pregnant in second trimester 35wk PPROM and SVD. I don't see cultures or placenta to path at that 09/2015 delivery. D/w pt re: 17p and CLs and she is amenable to this. 17p paperwork signed today and pt will have another CL with her anatomy scan tomorrow. Consider q2wk CLs until 24wks based on MFM recs tomorrow.  - [place] MFM OB COMP + 14 WK; Future - [place] MFM OB Transvaginal;  Future  3. Diet controlled gestational diabetes mellitus (GDM) in second trimester Failed early 3hr at [facility]. Patient had normal 2hr PP GTT after last child Baseline pre-x labs today and pt advised to start baby ASA. Patient didn't bring log book or meter but states AM fastings are in the 80s and 2hr PP in the 100s.  RTC 1-2wks for BS log check Consider fetal echo after mfm anatomy scan based on their recs - Comp Met (CMET) - Protein / Creatinine Ratio, Urine - Hemoglobin A1C  4. Pregnancy Quad screen at [facility] still pending Flu shot UTD  Problem list reviewed and updated.  Follow up in 1-2 weeks.  >50% of 20 min visit spent on counseling and coordination of care.    Courtney Romans MD Attending Center for [facility])

## 2016-11-05 ENCOUNTER — Other Ambulatory Visit: Payer: Self-pay | Admitting: Obstetrics and Gynecology

## 2016-11-05 ENCOUNTER — Encounter (HOSPITAL_COMMUNITY): Payer: Self-pay

## 2016-11-05 ENCOUNTER — Ambulatory Visit (HOSPITAL_COMMUNITY)
Admission: RE | Admit: 2016-11-05 | Discharge: 2016-11-05 | Disposition: A | Discharge status: Home / Self Care | Payer: Medicaid Other | Source: Ambulatory Visit | Attending: Obstetrics and Gynecology | Admitting: Obstetrics and Gynecology

## 2016-11-05 DIAGNOSIS — Z3A18 18 weeks gestation of pregnancy: Secondary | ICD-10-CM

## 2016-11-05 DIAGNOSIS — O09891 Supervision of other high risk pregnancies, first trimester: Secondary | ICD-10-CM

## 2016-11-05 DIAGNOSIS — O09212 Supervision of pregnancy with history of pre-term labor, second trimester: Secondary | ICD-10-CM | POA: Diagnosis not present

## 2016-11-05 DIAGNOSIS — O24425 Gestational diabetes mellitus in childbirth, controlled by oral hypoglycemic drugs: Secondary | ICD-10-CM

## 2016-11-05 DIAGNOSIS — Z363 Encounter for antenatal screening for malformations: Secondary | ICD-10-CM

## 2016-11-05 DIAGNOSIS — O09892 Supervision of other high risk pregnancies, second trimester: Secondary | ICD-10-CM | POA: Diagnosis not present

## 2016-11-05 DIAGNOSIS — Z8632 Personal history of gestational diabetes: Secondary | ICD-10-CM

## 2016-11-05 DIAGNOSIS — O09292 Supervision of pregnancy with other poor reproductive or obstetric history, second trimester: Secondary | ICD-10-CM | POA: Diagnosis not present

## 2016-11-05 DIAGNOSIS — O09299 Supervision of pregnancy with other poor reproductive or obstetric history, unspecified trimester: Secondary | ICD-10-CM

## 2016-11-05 LAB — PROTEIN / CREATININE RATIO, URINE
CREATININE, URINE: 33 mg/dL (ref 20–320)
PROTEIN CREATININE RATIO: 152 mg/g{creat} (ref 21–161)
TOTAL PROTEIN, URINE: 5 mg/dL (ref 5–24)

## 2016-11-06 ENCOUNTER — Other Ambulatory Visit (HOSPITAL_COMMUNITY): Payer: Self-pay | Admitting: *Deleted

## 2016-11-06 DIAGNOSIS — O09219 Supervision of pregnancy with history of pre-term labor, unspecified trimester: Secondary | ICD-10-CM

## 2016-11-12 ENCOUNTER — Ambulatory Visit (INDEPENDENT_AMBULATORY_CARE_PROVIDER_SITE_OTHER): Payer: Medicaid Other | Admitting: Family Medicine

## 2016-11-12 VITALS — BP 116/60 | HR 81 | Wt 97.2 lb

## 2016-11-12 DIAGNOSIS — O09292 Supervision of pregnancy with other poor reproductive or obstetric history, second trimester: Secondary | ICD-10-CM

## 2016-11-12 DIAGNOSIS — R87610 Atypical squamous cells of undetermined significance on cytologic smear of cervix (ASC-US): Secondary | ICD-10-CM

## 2016-11-12 DIAGNOSIS — O24410 Gestational diabetes mellitus in pregnancy, diet controlled: Secondary | ICD-10-CM

## 2016-11-12 DIAGNOSIS — R87810 Cervical high risk human papillomavirus (HPV) DNA test positive: Secondary | ICD-10-CM

## 2016-11-12 DIAGNOSIS — O09892 Supervision of other high risk pregnancies, second trimester: Secondary | ICD-10-CM

## 2016-11-12 DIAGNOSIS — O0992 Supervision of high risk pregnancy, unspecified, second trimester: Secondary | ICD-10-CM

## 2016-11-12 MED ORDER — HYDROXYPROGESTERONE CAPROATE 250 MG/ML IM OIL
250.0000 mg | TOPICAL_OIL | Freq: Once | INTRAMUSCULAR | Status: AC
  Administered 2016-11-12: 250 mg via INTRAMUSCULAR

## 2016-11-12 NOTE — Progress Notes (Signed)
   Subjective:  Courtney Nguyen is a 25 y.o. G2P0101 at 7413w6d being seen today for ongoing prenatal care.  She is currently monitored for the following issues for this high-risk pregnancy and has GDM (1st trimester dx); History of preterm premature rupture of membranes (PROM) in previous pregnancy, currently pregnant in second trimester; Short interval between pregnancies affecting pregnancy in second trimester, antepartum; ASCUS with positive high risk HPV cervical; and Supervision of high risk pregnancy in second trimester on her problem list.  Patient reports no complaints.   .  .  Movement: Present. Denies leaking of fluid.   The following portions of the patient's history were reviewed and updated as appropriate: allergies, current medications, past family history, past medical history, past social history, past surgical history and problem list. Problem list updated.  Objective:   Vitals:   11/12/16 1104  BP: 116/60  Pulse: 81  Weight: 97 lb 3.2 oz (44.1 kg)    Fetal Status: Fetal Heart Rate (bpm): 154 Fundal Height: 19 cm Movement: Present     General:  Alert, oriented and cooperative. Patient is in no acute distress.  Skin: Skin is warm and dry. No rash noted.   Cardiovascular: Normal heart rate noted  Respiratory: Normal respiratory effort, no problems with respiration noted  Abdomen: Soft, gravid, appropriate for gestational age. Pain/Pressure: Absent     Pelvic:  Cervical exam deferred        Extremities: Normal range of motion.  Edema: None  Mental Status: Normal mood and affect. Normal behavior. Normal judgment and thought content.   Urinalysis:      Assessment and Plan:  Pregnancy: G2P0101 at 4713w6d  1. Supervision of high risk pregnancy in second trimester - Makena injections  2. History of preterm premature rupture of membranes (PROM) in previous pregnancy, currently pregnant in second trimester  3. Short interval between pregnancies affecting pregnancy in second  trimester, antepartum  4. ASCUS with positive high risk HPV cervical - Colpo after delivery, at 6 weeks postpartum  5. Diet controlled gestational diabetes mellitus (GDM) in second trimester - Continue monitoring more frequently, to bring log with her to next visit written down. Fairly controlled on glucometer but not persistently taking BS so cannot assess if needing medications. - Possible fetal ECHO needed, plan per MFM after US  Preterm labor symptoms and general obstetric precautions including but not limited to vaginal bleeding, contractions, leaking of fluid and fetal movement were reviewed in detail with the patient. Please refer to After Visit Summary for other counseling recommendations.  Return in about 2 weeks (around 11/26/2016) for Blood sugar follow up; possible fetal ECHO needed, pending MFM US.   [facility] HospitalElizabeth Woodland Wilton CenterMumaw, [place]

## 2016-11-12 NOTE — Patient Instructions (Signed)
Gestational Diabetes Mellitus, Self Care When you have gestational diabetes (gestational diabetes mellitus), you must keep your blood sugar (glucose) under control. You can do this with:  Nutrition.  Exercise.  Lifestyle changes.  Medicines or insulin, if needed.  Support from your doctors and others. How do I manage my blood sugar?  Check your blood sugar every day during pregnancy. Check it as often as told.  Call your doctor if your blood sugar is above your goal numbers for 2 tests in a row. Your doctor will set treatment goals for you. Try to have these blood sugars:  After not eating for a long time (after fasting): at or below 95 mg/dL (5.3 mmol/L).  After meals (postprandial):  One hour after a meal: at or below 140 mg/dL (7.8 mmol/L).  Two hours after a meal: at or below 120 mg/dL (6.7 mmol/L).  A1c (hemoglobin A1c) level: 6-6.5%. What do I need to know about high blood sugar? High blood sugar is called hyperglycemia. Know the early signs of high blood sugar. Signs include:  Feeling:  Thirsty.  Hungry.  Very tired.  Needing to pee (urinate) more than usual.  Blurry vision. What do I need to know about low blood sugar? Low blood sugar is called hypoglycemia. This is when blood sugar is at or below 70 mg/dL (3.9 mmol/L). Symptoms may include:  Feeling:  Hungry.  Worried or nervous (anxious).  Sweaty or clammy.  Confused.  Dizzy.  Sleepy.  Sick to your stomach (nauseous).  Having:  A fast heartbeat.  A headache.  A change in vision.  Jerky movements that you cannot control (seizure).  Nightmares.  Tingling or no feeling (numbness) around the mouth, lips, or tongue.  Having trouble with:  Talking.  Paying attention (concentrating).  Moving (coordination).  Sleeping.  Shaking.  Passing out (fainting).  Getting upset easily (irritability). Treating low blood sugar   To treat low blood sugar, eat or drink something sugary  right away. If you can think clearly and swallow safely, follow the 15:15 rule:  Take 15 grams of a fast-acting carb (carbohydrate). Some fast-acting carbs are:  1 tube of glucose gel.  3 sugar tablets (glucose pills).  6-8 pieces of hard candy.  4 oz (120 mL) of fruit juice.  4 oz (120 mL) regular (not diet) soda.  Check your blood sugar 15 minutes after you take the carb.  If your blood sugar is still at or below 70 mg/dL (3.9 mmol/L), take 15 grams of a carb again.  If your blood sugar does not go above 70 mg/dL (3.9 mmol/L) after 3 tries, get help right away.  After your blood sugar goes back to normal, eat a meal or a snack within 1 hour. Treating very low blood sugar  If your blood sugar is at or below 54 mg/dL (3 mmol/L), you have very low blood sugar (severe hypoglycemia). This is an emergency. Do not wait to see if the symptoms will go away. Get medical help right away. Call your local emergency services (911 in the [place] Do not drive yourself to the hospital. If you have very low blood sugar and you cannot eat or drink, you may need a glucagon shot (injection). A family member or friend should learn:  How to check your blood sugar.  How to give you a glucagon shot. Ask your doctor if you need a glucagon shot kit at home. What else is important to manage my diabetes? Medicine   Take your insulin   medicines as told.  Do not run out of insulin or medicines.  Adjust your insulin and diabetes medicines as told. Food   Make healthy food choices. These include:  Chicken, fish, egg whites, and beans.  Oats, whole wheat, bulgur, brown rice, quinoa, and millet.  Fresh fruits and vegetables.  Low-fat dairy products.  Nuts, avocado, olive oil, and canola oil.  Make an eating plan. A food specialist (dietitian) can help you.  Follow instructions from your doctor about what you cannot eat or drink.  Drink enough fluid to keep your pee (urine) clear or  pale yellow.  Eat healthy snacks between healthy meals.  Keep track of carbs you eat. Read food labels. Learn about food serving sizes.  Follow your sick day plan when you cannot eat or drink normally. Make this plan with your doctor. Activity  Exercise 30 minutes or more a day or as much as told by your doctor.  Talk with your doctor if you start a new exercise. Your doctor may need to adjust your insulin, medicines, or food. Lifestyle  Do not drink alcohol.  Do not use any tobacco products. If you need help quitting, ask your doctor.  Learn how to deal with stress. If you need help with this, ask your doctor. Body care   Stay up to date with your shots (immunizations).  Brush your teeth and gums two times a day. Floss at least one time a day.  Go to the dentist least one time every 6 months.  Stay at a healthy weight while you are pregnant. General instructions  Take over-the-counter and prescription medicines only as told by your doctor.  Ask your doctor about risks of high blood pressure in pregnancy. These are called preeclampsia and eclampsia.  Share your diabetes care plan with:  Your work or school.  People you live with.  Check your pee for ketones:  When you are sick.  As told by your doctor.  Ask your doctor:  Do I need to meet with a diabetes educator?  Where can I find a support group for people with diabetes?  Carry a card or wear jewelry that says that you have diabetes.  Keep all follow-up visits with your doctor. This is important. Care after giving birth   Have your blood sugar checked 4-12 weeks after you give birth.  Get checked for diabetes at least every 3 years. Where to find more information: To learn more about diabetes, visit:  American Diabetes Association: www.diabetes.org/diabetes-basics/gestational  Centers for Disease Control and Prevention (CDC): http://sanchez-watson.com/.pdf This information is  not intended to replace advice given to you by your health care provider. Make sure you discuss any questions you have with your health care provider. Document Released: 04/01/2016 Document Revised: 05/16/2016 Document Reviewed: 01/12/2016 Elsevier Interactive Patient Education  2017 [employer].

## 2016-11-19 ENCOUNTER — Ambulatory Visit: Payer: Medicaid Other

## 2016-11-19 ENCOUNTER — Encounter (HOSPITAL_COMMUNITY): Payer: Self-pay

## 2016-11-19 ENCOUNTER — Ambulatory Visit (HOSPITAL_COMMUNITY)
Admission: RE | Admit: 2016-11-19 | Discharge: 2016-11-19 | Disposition: A | Discharge status: Home / Self Care | Payer: Medicaid Other | Source: Ambulatory Visit | Attending: Obstetrics and Gynecology | Admitting: Obstetrics and Gynecology

## 2016-11-19 VITALS — BP 98/49 | HR 86

## 2016-11-19 DIAGNOSIS — O09892 Supervision of other high risk pregnancies, second trimester: Secondary | ICD-10-CM | POA: Insufficient documentation

## 2016-11-19 DIAGNOSIS — O09219 Supervision of pregnancy with history of pre-term labor, unspecified trimester: Secondary | ICD-10-CM

## 2016-11-19 DIAGNOSIS — O09212 Supervision of pregnancy with history of pre-term labor, second trimester: Secondary | ICD-10-CM | POA: Diagnosis present

## 2016-11-19 DIAGNOSIS — O09292 Supervision of pregnancy with other poor reproductive or obstetric history, second trimester: Secondary | ICD-10-CM | POA: Insufficient documentation

## 2016-11-19 DIAGNOSIS — Z3A20 20 weeks gestation of pregnancy: Secondary | ICD-10-CM | POA: Insufficient documentation

## 2016-11-19 DIAGNOSIS — O0992 Supervision of high risk pregnancy, unspecified, second trimester: Secondary | ICD-10-CM

## 2016-11-19 MED ORDER — HYDROXYPROGESTERONE CAPROATE 250 MG/ML IM OIL
250.0000 mg | TOPICAL_OIL | Freq: Once | INTRAMUSCULAR | Status: AC
  Administered 2016-11-19: 250 mg via INTRAMUSCULAR

## 2016-11-19 NOTE — Progress Notes (Signed)
Patient presented to office today for her 17-p injection.

## 2016-11-19 NOTE — Addendum Note (Signed)
Encounter addended by: Kathrine HaddockHilary H Joab Carden, RT on: 11/19/2016  1:34 PM<BR>    Actions taken: Imaging Exam ended

## 2016-11-26 ENCOUNTER — Ambulatory Visit (INDEPENDENT_AMBULATORY_CARE_PROVIDER_SITE_OTHER): Payer: Medicaid Other | Admitting: Family Medicine

## 2016-11-26 VITALS — BP 123/59 | HR 89 | Wt 97.0 lb

## 2016-11-26 DIAGNOSIS — O24415 Gestational diabetes mellitus in pregnancy, controlled by oral hypoglycemic drugs: Secondary | ICD-10-CM | POA: Diagnosis not present

## 2016-11-26 DIAGNOSIS — O09212 Supervision of pregnancy with history of pre-term labor, second trimester: Secondary | ICD-10-CM

## 2016-11-26 DIAGNOSIS — O09292 Supervision of pregnancy with other poor reproductive or obstetric history, second trimester: Secondary | ICD-10-CM

## 2016-11-26 DIAGNOSIS — O0992 Supervision of high risk pregnancy, unspecified, second trimester: Secondary | ICD-10-CM

## 2016-11-26 DIAGNOSIS — O09892 Supervision of other high risk pregnancies, second trimester: Secondary | ICD-10-CM

## 2016-11-26 MED ORDER — HYDROXYPROGESTERONE CAPROATE 250 MG/ML IM OIL
250.0000 mg | TOPICAL_OIL | Freq: Once | INTRAMUSCULAR | Status: AC
  Administered 2016-11-26: 250 mg via INTRAMUSCULAR

## 2016-11-26 MED ORDER — METFORMIN HCL 500 MG PO TABS: 500.0000 mg | ORAL_TABLET | Freq: Two times a day (BID) | ORAL | 3 refills | Status: DC

## 2016-11-26 NOTE — Progress Notes (Signed)
   Subjective:  Courtney Nguyen is a 25 y.o. G2P0101 at 5832w6d being seen today for ongoing prenatal care.  She is currently monitored for the following issues for this high-risk pregnancy and has GDM (1st trimester dx); History of preterm premature rupture of membranes (PROM) in previous pregnancy, currently pregnant in second trimester; Short interval between pregnancies affecting pregnancy in second trimester, antepartum; ASCUS with positive high risk HPV cervical; and Supervision of high risk pregnancy in second trimester on her problem list.  Patient reports no complaints.  Contractions: Not present. Vag. Bleeding: None.  Movement: Present. Denies leaking of fluid.   Brought BS logs in with her. (see below in A&P for values).  The following portions of the patient's history were reviewed and updated as appropriate: allergies, current medications, past family history, past medical history, past social history, past surgical history and problem list. Problem list updated.  Objective:   Vitals:   11/26/16 1447  BP: (!) 123/59  Pulse: 89  Weight: 97 lb (44 kg)    Fetal Status: Fetal Heart Rate (bpm): 141   Movement: Present   Fundal Height: 21 cm  General:  Alert, oriented and cooperative. Patient is in no acute distress.  Skin: Skin is warm and dry. No rash noted.   Cardiovascular: Normal heart rate noted  Respiratory: Normal respiratory effort, no problems with respiration noted  Abdomen: Soft, gravid, appropriate for gestational age. Pain/Pressure: Absent     Pelvic:  Cervical exam deferred        Extremities: Normal range of motion.  Edema: None  Mental Status: Normal mood and affect. Normal behavior. Normal judgment and thought content.   Urinalysis:      Assessment and Plan:  Pregnancy: G2P0101 at 6832w6d  1. Supervision of high risk pregnancy in second trimester - US Fetal Echocardiography; Future  2. Gestational diabetes mellitus (GDM) in second trimester controlled on oral  hypoglycemic drug - Patient brought BS log in, starting on metformin due to no obvious trend of elevated sugars during a specific mealtime. FBS 77-84 with one elevated 92; 2 hr PP breakfast: 177/83/142/119/99/121; lunch: 101/114/138/88/135/102; dinner: 91/108/150/99/87/106 - US Fetal Echocardiography; Future - metFORMIN (GLUCOPHAGE) 500 MG tablet; Take 1 tablet (500 mg total) by mouth 2 (two) times daily with a meal.  Dispense: 60 tablet; Refill: 3 - Ambulatory referral to Ophthalmology  3. History of preterm premature rupture of membranes (PROM) in previous pregnancy, currently pregnant in second trimester - No signs/symptoms of preterm labor - hydroxyprogesterone caproate (MAKENA) 250 mg/mL injection 250 mg; Inject 1 mL (250 mg total) into the muscle once. -q2 week cervical length, normal so far  4. Short interval between pregnancies affecting pregnancy in second trimester, antepartum   Preterm labor symptoms and general obstetric precautions including but not limited to vaginal bleeding, contractions, leaking of fluid and fetal movement were reviewed in detail with the patient. Please refer to After Visit Summary for other counseling recommendations.  Return in about 4 weeks (around 12/24/2016) for Routine OB visit.   Michaele OfferElizabeth Woodland Mumaw, DO OB Fellow Center for [facility]

## 2016-11-26 NOTE — Patient Instructions (Signed)
Gestational Diabetes Mellitus, Self Care When you have gestational diabetes (gestational diabetes mellitus), you must keep your blood sugar (glucose) under control. You can do this with:  Nutrition.  Exercise.  Lifestyle changes.  Medicines or insulin, if needed.  Support from your doctors and others. How do I manage my blood sugar?  Check your blood sugar every day during pregnancy. Check it as often as told.  Call your doctor if your blood sugar is above your goal numbers for 2 tests in a row. Your doctor will set treatment goals for you. Try to have these blood sugars:  After not eating for a long time (after fasting): at or below 95 mg/dL (5.3 mmol/L).  After meals (postprandial):  One hour after a meal: at or below 140 mg/dL (7.8 mmol/L).  Two hours after a meal: at or below 120 mg/dL (6.7 mmol/L).  A1c (hemoglobin A1c) level: 6-6.5%. What do I need to know about high blood sugar? High blood sugar is called hyperglycemia. Know the early signs of high blood sugar. Signs include:  Feeling:  Thirsty.  Hungry.  Very tired.  Needing to pee (urinate) more than usual.  Blurry vision. What do I need to know about low blood sugar? Low blood sugar is called hypoglycemia. This is when blood sugar is at or below 70 mg/dL (3.9 mmol/L). Symptoms may include:  Feeling:  Hungry.  Worried or nervous (anxious).  Sweaty or clammy.  Confused.  Dizzy.  Sleepy.  Sick to your stomach (nauseous).  Having:  A fast heartbeat.  A headache.  A change in vision.  Jerky movements that you cannot control (seizure).  Nightmares.  Tingling or no feeling (numbness) around the mouth, lips, or tongue.  Having trouble with:  Talking.  Paying attention (concentrating).  Moving (coordination).  Sleeping.  Shaking.  Passing out (fainting).  Getting upset easily (irritability). Treating low blood sugar   To treat low blood sugar, eat or drink something sugary  right away. If you can think clearly and swallow safely, follow the 15:15 rule:  Take 15 grams of a fast-acting carb (carbohydrate). Some fast-acting carbs are:  1 tube of glucose gel.  3 sugar tablets (glucose pills).  6-8 pieces of hard candy.  4 oz (120 mL) of fruit juice.  4 oz (120 mL) regular (not diet) soda.  Check your blood sugar 15 minutes after you take the carb.  If your blood sugar is still at or below 70 mg/dL (3.9 mmol/L), take 15 grams of a carb again.  If your blood sugar does not go above 70 mg/dL (3.9 mmol/L) after 3 tries, get help right away.  After your blood sugar goes back to normal, eat a meal or a snack within 1 hour. Treating very low blood sugar  If your blood sugar is at or below 54 mg/dL (3 mmol/L), you have very low blood sugar (severe hypoglycemia). This is an emergency. Do not wait to see if the symptoms will go away. Get medical help right away. Call your local emergency services (911 in the [place] Do not drive yourself to the hospital. If you have very low blood sugar and you cannot eat or drink, you may need a glucagon shot (injection). A family member or friend should learn:  How to check your blood sugar.  How to give you a glucagon shot. Ask your doctor if you need a glucagon shot kit at home. What else is important to manage my diabetes? Medicine   Take your insulin   medicines as told.  Do not run out of insulin or medicines.  Adjust your insulin and diabetes medicines as told. Food   Make healthy food choices. These include:  Chicken, fish, egg whites, and beans.  Oats, whole wheat, bulgur, brown rice, quinoa, and millet.  Fresh fruits and vegetables.  Low-fat dairy products.  Nuts, avocado, olive oil, and canola oil.  Make an eating plan. A food specialist (dietitian) can help you.  Follow instructions from your doctor about what you cannot eat or drink.  Drink enough fluid to keep your pee (urine) clear or  pale yellow.  Eat healthy snacks between healthy meals.  Keep track of carbs you eat. Read food labels. Learn about food serving sizes.  Follow your sick day plan when you cannot eat or drink normally. Make this plan with your doctor. Activity  Exercise 30 minutes or more a day or as much as told by your doctor.  Talk with your doctor if you start a new exercise. Your doctor may need to adjust your insulin, medicines, or food. Lifestyle  Do not drink alcohol.  Do not use any tobacco products. If you need help quitting, ask your doctor.  Learn how to deal with stress. If you need help with this, ask your doctor. Body care   Stay up to date with your shots (immunizations).  Brush your teeth and gums two times a day. Floss at least one time a day.  Go to the dentist least one time every 6 months.  Stay at a healthy weight while you are pregnant. General instructions  Take over-the-counter and prescription medicines only as told by your doctor.  Ask your doctor about risks of high blood pressure in pregnancy. These are called preeclampsia and eclampsia.  Share your diabetes care plan with:  Your work or school.  People you live with.  Check your pee for ketones:  When you are sick.  As told by your doctor.  Ask your doctor:  Do I need to meet with a diabetes educator?  Where can I find a support group for people with diabetes?  Carry a card or wear jewelry that says that you have diabetes.  Keep all follow-up visits with your doctor. This is important. Care after giving birth   Have your blood sugar checked 4-12 weeks after you give birth.  Get checked for diabetes at least every 3 years. Where to find more information: To learn more about diabetes, visit:  American Diabetes Association: www.diabetes.org/diabetes-basics/gestational  Centers for Disease Control and Prevention (CDC): http://sanchez-watson.com/.pdf This information is  not intended to replace advice given to you by your health care provider. Make sure you discuss any questions you have with your health care provider. Document Released: 04/01/2016 Document Revised: 05/16/2016 Document Reviewed: 01/12/2016 Elsevier Interactive Patient Education  2017 [employer].

## 2016-11-29 ENCOUNTER — Telehealth: Payer: Self-pay | Admitting: General Practice

## 2016-11-29 NOTE — Telephone Encounter (Signed)
Per Dr Omer JackMumaw, patient needs fetal echo. [facility] 1[facility]. Called patient, no answer & voicemail was not set up. Will make note in appt

## 2016-12-03 ENCOUNTER — Ambulatory Visit (INDEPENDENT_AMBULATORY_CARE_PROVIDER_SITE_OTHER): Payer: Medicaid Other | Admitting: General Practice

## 2016-12-03 ENCOUNTER — Encounter (HOSPITAL_COMMUNITY): Payer: Self-pay

## 2016-12-03 ENCOUNTER — Ambulatory Visit (HOSPITAL_COMMUNITY)
Admission: RE | Admit: 2016-12-03 | Discharge: 2016-12-03 | Disposition: A | Discharge status: Home / Self Care | Payer: Medicaid Other | Source: Ambulatory Visit | Attending: Obstetrics and Gynecology | Admitting: Obstetrics and Gynecology

## 2016-12-03 ENCOUNTER — Ambulatory Visit: Payer: Self-pay

## 2016-12-03 VITALS — BP 105/51 | HR 69 | Ht 60.0 in | Wt 98.0 lb

## 2016-12-03 DIAGNOSIS — Z362 Encounter for other antenatal screening follow-up: Secondary | ICD-10-CM | POA: Insufficient documentation

## 2016-12-03 DIAGNOSIS — O09212 Supervision of pregnancy with history of pre-term labor, second trimester: Secondary | ICD-10-CM | POA: Insufficient documentation

## 2016-12-03 DIAGNOSIS — Z3A22 22 weeks gestation of pregnancy: Secondary | ICD-10-CM | POA: Diagnosis not present

## 2016-12-03 DIAGNOSIS — Z3686 Encounter for antenatal screening for cervical length: Secondary | ICD-10-CM | POA: Diagnosis not present

## 2016-12-03 DIAGNOSIS — O09892 Supervision of other high risk pregnancies, second trimester: Secondary | ICD-10-CM | POA: Insufficient documentation

## 2016-12-03 DIAGNOSIS — O09219 Supervision of pregnancy with history of pre-term labor, unspecified trimester: Secondary | ICD-10-CM

## 2016-12-03 DIAGNOSIS — O09292 Supervision of pregnancy with other poor reproductive or obstetric history, second trimester: Secondary | ICD-10-CM | POA: Diagnosis not present

## 2016-12-03 MED ORDER — HYDROXYPROGESTERONE CAPROATE 250 MG/ML IM OIL
250.0000 mg | TOPICAL_OIL | Freq: Once | INTRAMUSCULAR | Status: AC
Start: 2016-12-03 — End: 2016-12-03
  Administered 2016-12-03: 250 mg via INTRAMUSCULAR

## 2016-12-03 NOTE — Progress Notes (Signed)
Patient here for 17p today. Informed patient of fetal echo appt.

## 2016-12-04 ENCOUNTER — Telehealth: Payer: Self-pay

## 2016-12-04 ENCOUNTER — Other Ambulatory Visit (HOSPITAL_COMMUNITY): Payer: Self-pay | Admitting: *Deleted

## 2016-12-04 DIAGNOSIS — O24415 Gestational diabetes mellitus in pregnancy, controlled by oral hypoglycemic drugs: Secondary | ICD-10-CM

## 2016-12-04 DIAGNOSIS — O09219 Supervision of pregnancy with history of pre-term labor, unspecified trimester: Secondary | ICD-10-CM

## 2016-12-04 NOTE — Telephone Encounter (Signed)
ERROR

## 2016-12-04 NOTE — Addendum Note (Signed)
Addended by: Cheree DittoGRAHAM, Yalena Colon A on: 12/04/2016 03:12 PM   Modules accepted: Orders

## 2016-12-10 ENCOUNTER — Ambulatory Visit (INDEPENDENT_AMBULATORY_CARE_PROVIDER_SITE_OTHER): Payer: Medicaid Other | Admitting: *Deleted

## 2016-12-10 VITALS — BP 100/51 | HR 59

## 2016-12-10 DIAGNOSIS — O09892 Supervision of other high risk pregnancies, second trimester: Secondary | ICD-10-CM

## 2016-12-10 DIAGNOSIS — O09212 Supervision of pregnancy with history of pre-term labor, second trimester: Secondary | ICD-10-CM

## 2016-12-10 MED ORDER — HYDROXYPROGESTERONE CAPROATE 250 MG/ML IM OIL
250.0000 mg | TOPICAL_OIL | Freq: Once | INTRAMUSCULAR | Status: AC
  Administered 2016-12-10: 250 mg via INTRAMUSCULAR

## 2016-12-17 ENCOUNTER — Ambulatory Visit (HOSPITAL_COMMUNITY)
Admission: RE | Admit: 2016-12-17 | Discharge: 2016-12-17 | Disposition: A | Discharge status: Home / Self Care | Payer: Medicaid Other | Source: Ambulatory Visit | Attending: Obstetrics and Gynecology | Admitting: Obstetrics and Gynecology

## 2016-12-17 ENCOUNTER — Encounter (HOSPITAL_COMMUNITY): Payer: Self-pay

## 2016-12-17 ENCOUNTER — Other Ambulatory Visit (HOSPITAL_COMMUNITY): Payer: Self-pay | Admitting: Maternal and Fetal Medicine

## 2016-12-17 ENCOUNTER — Ambulatory Visit (INDEPENDENT_AMBULATORY_CARE_PROVIDER_SITE_OTHER): Payer: Medicaid Other | Admitting: General Practice

## 2016-12-17 VITALS — BP 114/47 | HR 88 | Ht 60.0 in | Wt 99.0 lb

## 2016-12-17 DIAGNOSIS — O09219 Supervision of pregnancy with history of pre-term labor, unspecified trimester: Secondary | ICD-10-CM

## 2016-12-17 DIAGNOSIS — O09892 Supervision of other high risk pregnancies, second trimester: Secondary | ICD-10-CM | POA: Diagnosis not present

## 2016-12-17 DIAGNOSIS — O09899 Supervision of other high risk pregnancies, unspecified trimester: Secondary | ICD-10-CM

## 2016-12-17 DIAGNOSIS — O09292 Supervision of pregnancy with other poor reproductive or obstetric history, second trimester: Secondary | ICD-10-CM | POA: Diagnosis not present

## 2016-12-17 DIAGNOSIS — Z3686 Encounter for antenatal screening for cervical length: Secondary | ICD-10-CM | POA: Diagnosis not present

## 2016-12-17 DIAGNOSIS — O09212 Supervision of pregnancy with history of pre-term labor, second trimester: Secondary | ICD-10-CM | POA: Diagnosis not present

## 2016-12-17 DIAGNOSIS — Z3A24 24 weeks gestation of pregnancy: Secondary | ICD-10-CM

## 2016-12-17 MED ORDER — HYDROXYPROGESTERONE CAPROATE 250 MG/ML IM OIL
250.0000 mg | TOPICAL_OIL | Freq: Once | INTRAMUSCULAR | Status: AC
  Administered 2016-12-17: 250 mg via INTRAMUSCULAR

## 2016-12-23 NOTE — L&D Delivery Note (Signed)
Vaginal Delivery Note  26 y.o. G2P0101 at 3460w2d delivered a viable female infant at 1044 in cephalic, ROA position. No nuchal cord. Left anterior shoulder delivered with ease. Sixty sec delayed cord clamping. Cord clamped x2 and cut. Placenta delivered spontaneously intact, with 3VC. Fundus firm on exam with massage and pitocin. There was some retained membrane located at external os which was easily removed with hemostat.  Mother: Anesthesia: Epidural Laceration: None Suture repair: N/A EBL: 200 mL  Baby: Apgars: 9, 9 Weight: Pending Cord pH: Not sent  Good hemostasis noted. Mom to postpartum.  Baby to Couplet care / Skin to Skin.  Wendee Beaversavid J McMullen, DO, PGY-1 03/14/2017, 11:02 AM  Midwife attestation: I was gloved and present for delivery in its entirety and I agree with the above resident's note.  Donette LarryMelanie Haislee Corso, CNM 11:52 AM

## 2016-12-24 ENCOUNTER — Encounter: Payer: Self-pay | Admitting: Obstetrics & Gynecology

## 2016-12-30 ENCOUNTER — Ambulatory Visit (INDEPENDENT_AMBULATORY_CARE_PROVIDER_SITE_OTHER): Payer: Medicaid Other | Admitting: Obstetrics & Gynecology

## 2016-12-30 VITALS — BP 106/49 | HR 78 | Wt 98.0 lb

## 2016-12-30 DIAGNOSIS — O09212 Supervision of pregnancy with history of pre-term labor, second trimester: Secondary | ICD-10-CM

## 2016-12-30 DIAGNOSIS — O0992 Supervision of high risk pregnancy, unspecified, second trimester: Secondary | ICD-10-CM

## 2016-12-30 MED ORDER — HYDROXYPROGESTERONE CAPROATE 250 MG/ML IM OIL
250.0000 mg | TOPICAL_OIL | Freq: Once | INTRAMUSCULAR | Status: AC
  Administered 2016-12-30: 250 mg via INTRAMUSCULAR

## 2016-12-30 NOTE — Progress Notes (Signed)
   PRENATAL VISIT NOTE  Subjective:  Courtney Nguyen is a 26 y.o. G2P0101 at 2275w5d being seen today for ongoing prenatal care.  She is currently monitored for the following issues for this high-risk pregnancy and has GDM (1st trimester dx); History of preterm premature rupture of membranes (PROM) in previous pregnancy, currently pregnant in second trimester; Short interval between pregnancies affecting pregnancy in second trimester, antepartum; ASCUS with positive high risk HPV cervical; and Supervision of high risk pregnancy in second trimester on her problem list.  Patient reports no complaints.   .  .  Movement: Present. Denies leaking of fluid.   The following portions of the patient's history were reviewed and updated as appropriate: allergies, current medications, past family history, past medical history, past social history, past surgical history and problem list. Problem list updated.  Objective:   Vitals:   12/30/16 0809  BP: (!) 106/49  Pulse: 78  Weight: 44.5 kg (98 lb)    Fetal Status: Fetal Heart Rate (bpm): 154   Movement: Present     General:  Alert, oriented and cooperative. Patient is in no acute distress.  Skin: Skin is warm and dry. No rash noted.   Cardiovascular: Normal heart rate noted  Respiratory: Normal respiratory effort, no problems with respiration noted  Abdomen: Soft, gravid, appropriate for gestational age. Pain/Pressure: Present     Pelvic:  Cervical exam deferred        Extremities: Normal range of motion.     Mental Status: Normal mood and affect. Normal behavior. Normal judgment and thought content.   Assessment and Plan:  Pregnancy: G2P0101 at 875w5d  1. Supervision of high risk pregnancy in second trimester US 1 week ago nl CL - HIV antibody - RPR - CBC State BG better but some still high if too many carbs eaten Preterm labor symptoms and general obstetric precautions including but not limited to vaginal bleeding, contractions, leaking of fluid  and fetal movement were reviewed in detail with the patient. Please refer to After Visit Summary for other counseling recommendations.  Return in about 2 years (around 12/30/2018) for 17 P 1 week.   Adam PhenixJames G Denijah Karrer, MD

## 2016-12-30 NOTE — Patient Instructions (Signed)
Third Trimester of Pregnancy The third trimester is from week 29 through week 40 (months 7 through 9). The third trimester is a time when the unborn baby (fetus) is growing rapidly. At the end of the ninth month, the fetus is about 20 inches in length and weighs 6-10 pounds. Body changes during your third trimester Your body goes through many changes during pregnancy. The changes vary from woman to woman. During the third trimester:  Your weight will continue to increase. You can expect to gain 25-35 pounds (11-16 kg) by the end of the pregnancy.  You may begin to get stretch marks on your hips, abdomen, and breasts.  You may urinate more often because the fetus is moving lower into your pelvis and pressing on your bladder.  You may develop or continue to have heartburn. This is caused by increased hormones that slow down muscles in the digestive tract.  You may develop or continue to have constipation because increased hormones slow digestion and cause the muscles that push waste through your intestines to relax.  You may develop hemorrhoids. These are swollen veins (varicose veins) in the rectum that can itch or be painful.  You may develop swollen, bulging veins (varicose veins) in your legs.  You may have increased body aches in the pelvis, back, or thighs. This is due to weight gain and increased hormones that are relaxing your joints.  You may have changes in your hair. These can include thickening of your hair, rapid growth, and changes in texture. Some women also have hair loss during or after pregnancy, or hair that feels dry or thin. Your hair will most likely return to normal after your baby is born.  Your breasts will continue to grow and they will continue to become tender. A yellow fluid (colostrum) may leak from your breasts. This is the first milk you are producing for your baby.  Your belly button may stick out.  You may notice more swelling in your hands, face, or  ankles.  You may have increased tingling or numbness in your hands, arms, and legs. The skin on your belly may also feel numb.  You may feel short of breath because of your expanding uterus.  You may have more problems sleeping. This can be caused by the size of your belly, increased need to urinate, and an increase in your body's metabolism.  You may notice the fetus "dropping," or moving lower in your abdomen.  You may have increased vaginal discharge.  Your cervix becomes thin and soft (effaced) near your due date. What to expect at prenatal visits You will have prenatal exams every 2 weeks until week 36. Then you will have weekly prenatal exams. During a routine prenatal visit:  You will be weighed to make sure you and the fetus are growing normally.  Your blood pressure will be taken.  Your abdomen will be measured to track your baby's growth.  The fetal heartbeat will be listened to.  Any test results from the previous visit will be discussed.  You may have a cervical check near your due date to see if you have effaced. At around 36 weeks, your health care provider will check your cervix. At the same time, your health care provider will also perform a test on the secretions of the vaginal tissue. This test is to determine if a type of bacteria, Group B streptococcus, is present. Your health care provider will explain this further. Your health care provider may ask you:    What your birth plan is.  How you are feeling.  If you are feeling the baby move.  If you have had any abnormal symptoms, such as leaking fluid, bleeding, severe headaches, or abdominal cramping.  If you are using any tobacco products, including cigarettes, chewing tobacco, and electronic cigarettes.  If you have any questions. Other tests or screenings that may be performed during your third trimester include:  Blood tests that check for low iron levels (anemia).  Fetal testing to check the health,  activity level, and growth of the fetus. Testing is done if you have certain medical conditions or if there are problems during the pregnancy.  Nonstress test (NST). This test checks the health of your baby to make sure there are no signs of problems, such as the baby not getting enough oxygen. During this test, a belt is placed around your belly. The baby is made to move, and its heart rate is monitored during movement. What is false labor? False labor is a condition in which you feel small, irregular tightenings of the muscles in the womb (contractions) that eventually go away. These are called Braxton Hicks contractions. Contractions may last for hours, days, or even weeks before true labor sets in. If contractions come at regular intervals, become more frequent, increase in intensity, or become painful, you should see your health care provider. What are the signs of labor?  Abdominal cramps.  Regular contractions that start at 10 minutes apart and become stronger and more frequent with time.  Contractions that start on the top of the uterus and spread down to the lower abdomen and back.  Increased pelvic pressure and dull back pain.  A watery or bloody mucus discharge that comes from the vagina.  Leaking of amniotic fluid. This is also known as your "water breaking." It could be a slow trickle or a gush. Let your doctor know if it has a color or strange odor. If you have any of these signs, call your health care provider right away, even if it is before your due date. Follow these instructions at home: Eating and drinking  Continue to eat regular, healthy meals.  Do not eat:  Raw meat or meat spreads.  Unpasteurized milk or cheese.  Unpasteurized juice.  Store-made salad.  Refrigerated smoked seafood.  Hot dogs or deli meat, unless they are piping hot.  More than 6 ounces of albacore tuna a week.  Shark, swordfish, king mackerel, or tile fish.  Store-made salads.  Raw  sprouts, such as mung bean or alfalfa sprouts.  Take prenatal vitamins as told by your health care provider.  Take 1000 mg of calcium daily as told by your health care provider.  If you develop constipation:  Take over-the-counter or prescription medicines.  Drink enough fluid to keep your urine clear or pale yellow.  Eat foods that are high in fiber, such as fresh fruits and vegetables, whole grains, and beans.  Limit foods that are high in fat and processed sugars, such as fried and sweet foods. Activity  Exercise only as directed by your health care provider. Healthy pregnant women should aim for 2 hours and 30 minutes of moderate exercise per week. If you experience any pain or discomfort while exercising, stop.  Avoid heavy lifting.  Do not exercise in extreme heat or humidity, or at high altitudes.  Wear low-heel, comfortable shoes.  Practice good posture.  Do not travel far distances unless it is absolutely necessary and only with the approval   of your health care provider.  Wear your seat belt at all times while in a car, on a bus, or on a plane.  Take frequent breaks and rest with your legs elevated if you have leg cramps or low back pain.  Do not use hot tubs, steam rooms, or saunas.  You may continue to have sex unless your health care provider tells you otherwise. Lifestyle  Do not use any products that contain nicotine or tobacco, such as cigarettes and e-cigarettes. If you need help quitting, ask your health care provider.  Do not drink alcohol.  Do not use any medicinal herbs or unprescribed drugs. These chemicals affect the formation and growth of the baby.  If you develop varicose veins:  Wear support pantyhose or compression stockings as told by your healthcare provider.  Elevate your feet for 15 minutes, 3-4 times a day.  Wear a supportive maternity bra to help with breast tenderness. General instructions  Take over-the-counter and prescription  medicines only as told by your health care provider. There are medicines that are either safe or unsafe to take during pregnancy.  Take warm sitz baths to soothe any pain or discomfort caused by hemorrhoids. Use hemorrhoid cream or witch hazel if your health care provider approves.  Avoid cat litter boxes and soil used by cats. These carry germs that can cause birth defects in the baby. If you have a cat, ask someone to clean the litter box for you.  To prepare for the arrival of your baby:  Take prenatal classes to understand, practice, and ask questions about the labor and delivery.  Make a trial run to the hospital.  Visit the hospital and tour the maternity area.  Arrange for maternity or paternity leave through employers.  Arrange for family and friends to take care of pets while you are in the hospital.  Purchase a rear-facing car seat and make sure you know how to install it in your car.  Pack your hospital bag.  Prepare the baby's nursery. Make sure to remove all pillows and stuffed animals from the baby's crib to prevent suffocation.  Visit your dentist if you have not gone during your pregnancy. Use a soft toothbrush to brush your teeth and be gentle when you floss.  Keep all prenatal follow-up visits as told by your health care provider. This is important. Contact a health care provider if:  You are unsure if you are in labor or if your water has broken.  You become dizzy.  You have mild pelvic cramps, pelvic pressure, or nagging pain in your abdominal area.  You have lower back pain.  You have persistent nausea, vomiting, or diarrhea.  You have an unusual or bad smelling vaginal discharge.  You have pain when you urinate. Get help right away if:  You have a fever.  You are leaking fluid from your vagina.  You have spotting or bleeding from your vagina.  You have severe abdominal pain or cramping.  You have rapid weight loss or weight gain.  You have  shortness of breath with chest pain.  You notice sudden or extreme swelling of your face, hands, ankles, feet, or legs.  Your baby makes fewer than 10 movements in 2 hours.  You have severe headaches that do not go away with medicine.  You have vision changes. Summary  The third trimester is from week 29 through week 40, months 7 through 9. The third trimester is a time when the unborn baby (fetus)   is growing rapidly.  During the third trimester, your discomfort may increase as you and your baby continue to gain weight. You may have abdominal, leg, and back pain, sleeping problems, and an increased need to urinate.  During the third trimester your breasts will keep growing and they will continue to become tender. A yellow fluid (colostrum) may leak from your breasts. This is the first milk you are producing for your baby.  False labor is a condition in which you feel small, irregular tightenings of the muscles in the womb (contractions) that eventually go away. These are called Braxton Hicks contractions. Contractions may last for hours, days, or even weeks before true labor sets in.  Signs of labor can include: abdominal cramps; regular contractions that start at 10 minutes apart and become stronger and more frequent with time; watery or bloody mucus discharge that comes from the vagina; increased pelvic pressure and dull back pain; and leaking of amniotic fluid. This information is not intended to replace advice given to you by your health care provider. Make sure you discuss any questions you have with your health care provider. Document Released: 12/03/2001 Document Revised: 05/16/2016 Document Reviewed: 02/09/2013 [employer] Interactive Patient Education  2017 [employer]  

## 2016-12-30 NOTE — Progress Notes (Signed)
Decline tdap and flu

## 2016-12-31 ENCOUNTER — Other Ambulatory Visit (HOSPITAL_COMMUNITY): Payer: Self-pay

## 2017-01-06 ENCOUNTER — Ambulatory Visit (INDEPENDENT_AMBULATORY_CARE_PROVIDER_SITE_OTHER): Payer: Medicaid Other | Admitting: *Deleted

## 2017-01-06 VITALS — BP 104/54 | HR 75 | Wt 98.4 lb

## 2017-01-06 DIAGNOSIS — O09212 Supervision of pregnancy with history of pre-term labor, second trimester: Secondary | ICD-10-CM | POA: Diagnosis not present

## 2017-01-06 DIAGNOSIS — O09892 Supervision of other high risk pregnancies, second trimester: Secondary | ICD-10-CM

## 2017-01-06 MED ORDER — HYDROXYPROGESTERONE CAPROATE 250 MG/ML IM OIL
250.0000 mg | TOPICAL_OIL | Freq: Once | INTRAMUSCULAR | Status: AC
  Administered 2017-01-06: 250 mg via INTRAMUSCULAR

## 2017-01-06 NOTE — Progress Notes (Signed)
17P given to RUOQ as scheduled - pt tolerated well

## 2017-01-13 ENCOUNTER — Ambulatory Visit (INDEPENDENT_AMBULATORY_CARE_PROVIDER_SITE_OTHER): Payer: Medicaid Other | Admitting: Family Medicine

## 2017-01-13 VITALS — BP 106/60 | HR 78 | Wt 99.8 lb

## 2017-01-13 DIAGNOSIS — Z23 Encounter for immunization: Secondary | ICD-10-CM | POA: Diagnosis not present

## 2017-01-13 DIAGNOSIS — O24415 Gestational diabetes mellitus in pregnancy, controlled by oral hypoglycemic drugs: Secondary | ICD-10-CM

## 2017-01-13 DIAGNOSIS — O09892 Supervision of other high risk pregnancies, second trimester: Secondary | ICD-10-CM

## 2017-01-13 DIAGNOSIS — O09292 Supervision of pregnancy with other poor reproductive or obstetric history, second trimester: Secondary | ICD-10-CM

## 2017-01-13 DIAGNOSIS — O0992 Supervision of high risk pregnancy, unspecified, second trimester: Secondary | ICD-10-CM

## 2017-01-13 LAB — CBC
HEMATOCRIT: 34.1 % — AB (ref 35.0–45.0)
HEMOGLOBIN: 11 g/dL — AB (ref 11.7–15.5)
MCH: 29.1 pg (ref 27.0–33.0)
MCHC: 32.3 g/dL (ref 32.0–36.0)
MCV: 90.2 fL (ref 80.0–100.0)
MPV: 10.2 fL (ref 7.5–12.5)
Platelets: 247 10*3/uL (ref 140–400)
RBC: 3.78 MIL/uL — AB (ref 3.80–5.10)
RDW: 14.2 % (ref 11.0–15.0)
WBC: 4.5 10*3/uL (ref 3.8–10.8)

## 2017-01-13 LAB — POCT URINALYSIS DIP (DEVICE)
Bilirubin Urine: NEGATIVE
GLUCOSE, UA: NEGATIVE mg/dL
Hgb urine dipstick: NEGATIVE
Ketones, ur: NEGATIVE mg/dL
NITRITE: NEGATIVE
PROTEIN: NEGATIVE mg/dL
Specific Gravity, Urine: 1.01 (ref 1.005–1.030)
UROBILINOGEN UA: 0.2 mg/dL (ref 0.0–1.0)
pH: 6 (ref 5.0–8.0)

## 2017-01-13 LAB — HIV ANTIBODY (ROUTINE TESTING W REFLEX): HIV: NONREACTIVE

## 2017-01-13 MED ORDER — HYDROXYPROGESTERONE CAPROATE 250 MG/ML IM OIL
250.0000 mg | TOPICAL_OIL | Freq: Once | INTRAMUSCULAR | Status: AC
  Administered 2017-01-13: 250 mg via INTRAMUSCULAR

## 2017-01-13 MED ORDER — GLYBURIDE 2.5 MG PO TABS: 1.2500 mg | ORAL_TABLET | ORAL | 3 refills | Status: DC

## 2017-01-13 MED ORDER — HYDROXYPROGESTERONE CAPROATE 250 MG/ML IM OIL
250.0000 mg | TOPICAL_OIL | INTRAMUSCULAR | Status: DC
  Administered 2017-02-10 – 2017-03-10 (×5): 250 mg via INTRAMUSCULAR

## 2017-01-13 NOTE — Patient Instructions (Signed)
Third Trimester of Pregnancy The third trimester is from week 29 through week 40 (months 7 through 9). The third trimester is a time when the unborn baby (fetus) is growing rapidly. At the end of the ninth month, the fetus is about 20 inches in length and weighs 6-10 pounds. Body changes during your third trimester Your body goes through many changes during pregnancy. The changes vary from woman to woman. During the third trimester:  Your weight will continue to increase. You can expect to gain 25-35 pounds (11-16 kg) by the end of the pregnancy.  You may begin to get stretch marks on your hips, abdomen, and breasts.  You may urinate more often because the fetus is moving lower into your pelvis and pressing on your bladder.  You may develop or continue to have heartburn. This is caused by increased hormones that slow down muscles in the digestive tract.  You may develop or continue to have constipation because increased hormones slow digestion and cause the muscles that push waste through your intestines to relax.  You may develop hemorrhoids. These are swollen veins (varicose veins) in the rectum that can itch or be painful.  You may develop swollen, bulging veins (varicose veins) in your legs.  You may have increased body aches in the pelvis, back, or thighs. This is due to weight gain and increased hormones that are relaxing your joints.  You may have changes in your hair. These can include thickening of your hair, rapid growth, and changes in texture. Some women also have hair loss during or after pregnancy, or hair that feels dry or thin. Your hair will most likely return to normal after your baby is born.  Your breasts will continue to grow and they will continue to become tender. A yellow fluid (colostrum) may leak from your breasts. This is the first milk you are producing for your baby.  Your belly button may stick out.  You may notice more swelling in your hands, face, or  ankles.  You may have increased tingling or numbness in your hands, arms, and legs. The skin on your belly may also feel numb.  You may feel short of breath because of your expanding uterus.  You may have more problems sleeping. This can be caused by the size of your belly, increased need to urinate, and an increase in your body's metabolism.  You may notice the fetus "dropping," or moving lower in your abdomen.  You may have increased vaginal discharge.  Your cervix becomes thin and soft (effaced) near your due date. What to expect at prenatal visits You will have prenatal exams every 2 weeks until week 36. Then you will have weekly prenatal exams. During a routine prenatal visit:  You will be weighed to make sure you and the fetus are growing normally.  Your blood pressure will be taken.  Your abdomen will be measured to track your baby's growth.  The fetal heartbeat will be listened to.  Any test results from the previous visit will be discussed.  You may have a cervical check near your due date to see if you have effaced. At around 36 weeks, your health care provider will check your cervix. At the same time, your health care provider will also perform a test on the secretions of the vaginal tissue. This test is to determine if a type of bacteria, Group B streptococcus, is present. Your health care provider will explain this further. Your health care provider may ask you:    What your birth plan is.  How you are feeling.  If you are feeling the baby move.  If you have had any abnormal symptoms, such as leaking fluid, bleeding, severe headaches, or abdominal cramping.  If you are using any tobacco products, including cigarettes, chewing tobacco, and electronic cigarettes.  If you have any questions. Other tests or screenings that may be performed during your third trimester include:  Blood tests that check for low iron levels (anemia).  Fetal testing to check the health,  activity level, and growth of the fetus. Testing is done if you have certain medical conditions or if there are problems during the pregnancy.  Nonstress test (NST). This test checks the health of your baby to make sure there are no signs of problems, such as the baby not getting enough oxygen. During this test, a belt is placed around your belly. The baby is made to move, and its heart rate is monitored during movement. What is false labor? False labor is a condition in which you feel small, irregular tightenings of the muscles in the womb (contractions) that eventually go away. These are called Braxton Hicks contractions. Contractions may last for hours, days, or even weeks before true labor sets in. If contractions come at regular intervals, become more frequent, increase in intensity, or become painful, you should see your health care provider. What are the signs of labor?  Abdominal cramps.  Regular contractions that start at 10 minutes apart and become stronger and more frequent with time.  Contractions that start on the top of the uterus and spread down to the lower abdomen and back.  Increased pelvic pressure and dull back pain.  A watery or bloody mucus discharge that comes from the vagina.  Leaking of amniotic fluid. This is also known as your "water breaking." It could be a slow trickle or a gush. Let your doctor know if it has a color or strange odor. If you have any of these signs, call your health care provider right away, even if it is before your due date. Follow these instructions at home: Eating and drinking  Continue to eat regular, healthy meals.  Do not eat:  Raw meat or meat spreads.  Unpasteurized milk or cheese.  Unpasteurized juice.  Store-made salad.  Refrigerated smoked seafood.  Hot dogs or deli meat, unless they are piping hot.  More than 6 ounces of albacore tuna a week.  Shark, swordfish, king mackerel, or tile fish.  Store-made salads.  Raw  sprouts, such as mung bean or alfalfa sprouts.  Take prenatal vitamins as told by your health care provider.  Take 1000 mg of calcium daily as told by your health care provider.  If you develop constipation:  Take over-the-counter or prescription medicines.  Drink enough fluid to keep your urine clear or pale yellow.  Eat foods that are high in fiber, such as fresh fruits and vegetables, whole grains, and beans.  Limit foods that are high in fat and processed sugars, such as fried and sweet foods. Activity  Exercise only as directed by your health care provider. Healthy pregnant women should aim for 2 hours and 30 minutes of moderate exercise per week. If you experience any pain or discomfort while exercising, stop.  Avoid heavy lifting.  Do not exercise in extreme heat or humidity, or at high altitudes.  Wear low-heel, comfortable shoes.  Practice good posture.  Do not travel far distances unless it is absolutely necessary and only with the approval   of your health care provider.  Wear your seat belt at all times while in a car, on a bus, or on a plane.  Take frequent breaks and rest with your legs elevated if you have leg cramps or low back pain.  Do not use hot tubs, steam rooms, or saunas.  You may continue to have sex unless your health care provider tells you otherwise. Lifestyle  Do not use any products that contain nicotine or tobacco, such as cigarettes and e-cigarettes. If you need help quitting, ask your health care provider.  Do not drink alcohol.  Do not use any medicinal herbs or unprescribed drugs. These chemicals affect the formation and growth of the baby.  If you develop varicose veins:  Wear support pantyhose or compression stockings as told by your healthcare provider.  Elevate your feet for 15 minutes, 3-4 times a day.  Wear a supportive maternity bra to help with breast tenderness. General instructions  Take over-the-counter and prescription  medicines only as told by your health care provider. There are medicines that are either safe or unsafe to take during pregnancy.  Take warm sitz baths to soothe any pain or discomfort caused by hemorrhoids. Use hemorrhoid cream or witch hazel if your health care provider approves.  Avoid cat litter boxes and soil used by cats. These carry germs that can cause birth defects in the baby. If you have a cat, ask someone to clean the litter box for you.  To prepare for the arrival of your baby:  Take prenatal classes to understand, practice, and ask questions about the labor and delivery.  Make a trial run to the hospital.  Visit the hospital and tour the maternity area.  Arrange for maternity or paternity leave through employers.  Arrange for family and friends to take care of pets while you are in the hospital.  Purchase a rear-facing car seat and make sure you know how to install it in your car.  Pack your hospital bag.  Prepare the baby's nursery. Make sure to remove all pillows and stuffed animals from the baby's crib to prevent suffocation.  Visit your dentist if you have not gone during your pregnancy. Use a soft toothbrush to brush your teeth and be gentle when you floss.  Keep all prenatal follow-up visits as told by your health care provider. This is important. Contact a health care provider if:  You are unsure if you are in labor or if your water has broken.  You become dizzy.  You have mild pelvic cramps, pelvic pressure, or nagging pain in your abdominal area.  You have lower back pain.  You have persistent nausea, vomiting, or diarrhea.  You have an unusual or bad smelling vaginal discharge.  You have pain when you urinate. Get help right away if:  You have a fever.  You are leaking fluid from your vagina.  You have spotting or bleeding from your vagina.  You have severe abdominal pain or cramping.  You have rapid weight loss or weight gain.  You have  shortness of breath with chest pain.  You notice sudden or extreme swelling of your face, hands, ankles, feet, or legs.  Your baby makes fewer than 10 movements in 2 hours.  You have severe headaches that do not go away with medicine.  You have vision changes. Summary  The third trimester is from week 29 through week 40, months 7 through 9. The third trimester is a time when the unborn baby (fetus)   is growing rapidly.  During the third trimester, your discomfort may increase as you and your baby continue to gain weight. You may have abdominal, leg, and back pain, sleeping problems, and an increased need to urinate.  During the third trimester your breasts will keep growing and they will continue to become tender. A yellow fluid (colostrum) may leak from your breasts. This is the first milk you are producing for your baby.  False labor is a condition in which you feel small, irregular tightenings of the muscles in the womb (contractions) that eventually go away. These are called Braxton Hicks contractions. Contractions may last for hours, days, or even weeks before true labor sets in.  Signs of labor can include: abdominal cramps; regular contractions that start at 10 minutes apart and become stronger and more frequent with time; watery or bloody mucus discharge that comes from the vagina; increased pelvic pressure and dull back pain; and leaking of amniotic fluid. This information is not intended to replace advice given to you by your health care provider. Make sure you discuss any questions you have with your health care provider. Document Released: 12/03/2001 Document Revised: 05/16/2016 Document Reviewed: 02/09/2013 [employer] Interactive Patient Education  2017 [employer]   Breastfeeding Deciding to breastfeed is one of the best choices you can make for you and your baby. A change in hormones during pregnancy causes your breast tissue to grow and increases the number and size of your  milk ducts. These hormones also allow proteins, sugars, and fats from your blood supply to make breast milk in your milk-producing glands. Hormones prevent breast milk from being released before your baby is born as well as prompt milk flow after birth. Once breastfeeding has begun, thoughts of your baby, as well as his or her sucking or crying, can stimulate the release of milk from your milk-producing glands. Benefits of breastfeeding For Your Baby  Your first milk (colostrum) helps your baby's digestive system function better.  There are antibodies in your milk that help your baby fight off infections.  Your baby has a lower incidence of asthma, allergies, and sudden infant death syndrome.  The nutrients in breast milk are better for your baby than infant formulas and are designed uniquely for your baby's needs.  Breast milk improves your baby's brain development.  Your baby is less likely to develop other conditions, such as childhood obesity, asthma, or type 2 diabetes mellitus. For You  Breastfeeding helps to create a very special bond between you and your baby.  Breastfeeding is convenient. Breast milk is always available at the correct temperature and costs nothing.  Breastfeeding helps to burn calories and helps you lose the weight gained during pregnancy.  Breastfeeding makes your uterus contract to its prepregnancy size faster and slows bleeding (lochia) after you give birth.  Breastfeeding helps to lower your risk of developing type 2 diabetes mellitus, osteoporosis, and breast or ovarian cancer later in life. Signs that your baby is hungry Early Signs of Hunger  Increased alertness or activity.  Stretching.  Movement of the head from side to side.  Movement of the head and opening of the mouth when the corner of the mouth or cheek is stroked (rooting).  Increased sucking sounds, smacking lips, cooing, sighing, or squeaking.  Hand-to-mouth movements.  Increased  sucking of fingers or hands. Late Signs of Hunger  Fussing.  Intermittent crying. Extreme Signs of Hunger  Signs of extreme hunger will require calming and consoling before your baby will   be able to breastfeed successfully. Do not wait for the following signs of extreme hunger to occur before you initiate breastfeeding:  Restlessness.  A loud, strong cry.  Screaming. Breastfeeding basics  Breastfeeding Initiation  Find a comfortable place to sit or lie down, with your neck and back well supported.  Place a pillow or rolled up blanket under your baby to bring him or her to the level of your breast (if you are seated). Nursing pillows are specially designed to help support your arms and your baby while you breastfeed.  Make sure that your baby's abdomen is facing your abdomen.  Gently massage your breast. With your fingertips, massage from your chest wall toward your nipple in a circular motion. This encourages milk flow. You may need to continue this action during the feeding if your milk flows slowly.  Support your breast with 4 fingers underneath and your thumb above your nipple. Make sure your fingers are well away from your nipple and your baby's mouth.  Stroke your baby's lips gently with your finger or nipple.  When your baby's mouth is open wide enough, quickly bring your baby to your breast, placing your entire nipple and as much of the colored area around your nipple (areola) as possible into your baby's mouth.  More areola should be visible above your baby's upper lip than below the lower lip.  Your baby's tongue should be between his or her lower gum and your breast.  Ensure that your baby's mouth is correctly positioned around your nipple (latched). Your baby's lips should create a seal on your breast and be turned out (everted).  It is common for your baby to suck about 2-3 minutes in order to start the flow of breast milk. Latching  Teaching your baby how to latch  on to your breast properly is very important. An improper latch can cause nipple pain and decreased milk supply for you and poor weight gain in your baby. Also, if your baby is not latched onto your nipple properly, he or she may swallow some air during feeding. This can make your baby fussy. Burping your baby when you switch breasts during the feeding can help to get rid of the air. However, teaching your baby to latch on properly is still the best way to prevent fussiness from swallowing air while breastfeeding. Signs that your baby has successfully latched on to your nipple:  Silent tugging or silent sucking, without causing you pain.  Swallowing heard between every 3-4 sucks.  Muscle movement above and in front of his or her ears while sucking. Signs that your baby has not successfully latched on to nipple:  Sucking sounds or smacking sounds from your baby while breastfeeding.  Nipple pain. If you think your baby has not latched on correctly, slip your finger into the corner of your baby's mouth to break the suction and place it between your baby's gums. Attempt breastfeeding initiation again. Signs of Successful Breastfeeding  Signs from your baby:  A gradual decrease in the number of sucks or complete cessation of sucking.  Falling asleep.  Relaxation of his or her body.  Retention of a small amount of milk in his or her mouth.  Letting go of your breast by himself or herself. Signs from you:  Breasts that have increased in firmness, weight, and size 1-3 hours after feeding.  Breasts that are softer immediately after breastfeeding.  Increased milk volume, as well as a change in milk consistency and color by   the fifth day of breastfeeding.  Nipples that are not sore, cracked, or bleeding. Signs That Your Baby is Getting Enough Milk  Wetting at least 1-2 diapers during the first 24 hours after birth.  Wetting at least 5-6 diapers every 24 hours for the first week after  birth. The urine should be clear or pale yellow by 5 days after birth.  Wetting 6-8 diapers every 24 hours as your baby continues to grow and develop.  At least 3 stools in a 24-hour period by age 5 days. The stool should be soft and yellow.  At least 3 stools in a 24-hour period by age 7 days. The stool should be seedy and yellow.  No loss of weight greater than 10% of birth weight during the first 3 days of age.  Average weight gain of 4-7 ounces (113-198 g) per week after age 4 days.  Consistent daily weight gain by age 5 days, without weight loss after the age of 2 weeks. After a feeding, your baby may spit up a small amount. This is common. Breastfeeding frequency and duration Frequent feeding will help you make more milk and can prevent sore nipples and breast engorgement. Breastfeed when you feel the need to reduce the fullness of your breasts or when your baby shows signs of hunger. This is called "breastfeeding on demand." Avoid introducing a pacifier to your baby while you are working to establish breastfeeding (the first 4-6 weeks after your baby is born). After this time you may choose to use a pacifier. Research has shown that pacifier use during the first year of a baby's life decreases the risk of sudden infant death syndrome (SIDS). Allow your baby to feed on each breast as long as he or she wants. Breastfeed until your baby is finished feeding. When your baby unlatches or falls asleep while feeding from the first breast, offer the second breast. Because newborns are often sleepy in the first few weeks of life, you may need to awaken your baby to get him or her to feed. Breastfeeding times will vary from baby to baby. However, the following rules can serve as a guide to help you ensure that your baby is properly fed:  Newborns (babies 4 weeks of age or younger) may breastfeed every 1-3 hours.  Newborns should not go longer than 3 hours during the day or 5 hours during the night  without breastfeeding.  You should breastfeed your baby a minimum of 8 times in a 24-hour period until you begin to introduce solid foods to your baby at around 6 months of age. Breast milk pumping Pumping and storing breast milk allows you to ensure that your baby is exclusively fed your breast milk, even at times when you are unable to breastfeed. This is especially important if you are going back to work while you are still breastfeeding or when you are not able to be present during feedings. Your lactation consultant can give you guidelines on how long it is safe to store breast milk. A breast pump is a machine that allows you to pump milk from your breast into a sterile bottle. The pumped breast milk can then be stored in a refrigerator or freezer. Some breast pumps are operated by hand, while others use electricity. Ask your lactation consultant which type will work best for you. Breast pumps can be purchased, but some hospitals and breastfeeding support groups lease breast pumps on a monthly basis. A lactation consultant can teach you how to   hand express breast milk, if you prefer not to use a pump. Caring for your breasts while you breastfeed Nipples can become dry, cracked, and sore while breastfeeding. The following recommendations can help keep your breasts moisturized and healthy:  Avoid using soap on your nipples.  Wear a supportive bra. Although not required, special nursing bras and tank tops are designed to allow access to your breasts for breastfeeding without taking off your entire bra or top. Avoid wearing underwire-style bras or extremely tight bras.  Air dry your nipples for 3-4minutes after each feeding.  Use only cotton bra pads to absorb leaked breast milk. Leaking of breast milk between feedings is normal.  Use lanolin on your nipples after breastfeeding. Lanolin helps to maintain your skin's normal moisture barrier. If you use pure lanolin, you do not need to wash it off  before feeding your baby again. Pure lanolin is not toxic to your baby. You may also hand express a few drops of breast milk and gently massage that milk into your nipples and allow the milk to air dry. In the first few weeks after giving birth, some women experience extremely full breasts (engorgement). Engorgement can make your breasts feel heavy, warm, and tender to the touch. Engorgement peaks within 3-5 days after you give birth. The following recommendations can help ease engorgement:  Completely empty your breasts while breastfeeding or pumping. You may want to start by applying warm, moist heat (in the shower or with warm water-soaked hand towels) just before feeding or pumping. This increases circulation and helps the milk flow. If your baby does not completely empty your breasts while breastfeeding, pump any extra milk after he or she is finished.  Wear a snug bra (nursing or regular) or tank top for 1-2 days to signal your body to slightly decrease milk production.  Apply ice packs to your breasts, unless this is too uncomfortable for you.  Make sure that your baby is latched on and positioned properly while breastfeeding. If engorgement persists after 48 hours of following these recommendations, contact your health care provider or a lactation consultant. Overall health care recommendations while breastfeeding  Eat healthy foods. Alternate between meals and snacks, eating 3 of each per day. Because what you eat affects your breast milk, some of the foods may make your baby more irritable than usual. Avoid eating these foods if you are sure that they are negatively affecting your baby.  Drink milk, fruit juice, and water to satisfy your thirst (about 10 glasses a day).  Rest often, relax, and continue to take your prenatal vitamins to prevent fatigue, stress, and anemia.  Continue breast self-awareness checks.  Avoid chewing and smoking tobacco. Chemicals from cigarettes that pass  into breast milk and exposure to secondhand smoke may harm your baby.  Avoid alcohol and drug use, including marijuana. Some medicines that may be harmful to your baby can pass through breast milk. It is important to ask your health care provider before taking any medicine, including all over-the-counter and prescription medicine as well as vitamin and herbal supplements. It is possible to become pregnant while breastfeeding. If birth control is desired, ask your health care provider about options that will be safe for your baby. Contact a health care provider if:  You feel like you want to stop breastfeeding or have become frustrated with breastfeeding.  You have painful breasts or nipples.  Your nipples are cracked or bleeding.  Your breasts are red, tender, or warm.  You have   a swollen area on either breast.  You have a fever or chills.  You have nausea or vomiting.  You have drainage other than breast milk from your nipples.  Your breasts do not become full before feedings by the fifth day after you give birth.  You feel sad and depressed.  Your baby is too sleepy to eat well.  Your baby is having trouble sleeping.  Your baby is wetting less than 3 diapers in a 24-hour period.  Your baby has less than 3 stools in a 24-hour period.  Your baby's skin or the white part of his or her eyes becomes yellow.  Your baby is not gaining weight by 5 days of age. Get help right away if:  Your baby is overly tired (lethargic) and does not want to wake up and feed.  Your baby develops an unexplained fever. This information is not intended to replace advice given to you by your health care provider. Make sure you discuss any questions you have with your health care provider. Document Released: 12/09/2005 Document Revised: 05/22/2016 Document Reviewed: 06/02/2013 Elsevier Interactive Patient Education  2017 [employer] Inc[employer]  

## 2017-01-13 NOTE — Progress Notes (Signed)
   PRENATAL VISIT NOTE  Subjective:  Courtney Nguyen is a 26 y.o. G2P0101 at 5118w5d being seen today for ongoing prenatal care.  She is currently monitored for the following issues for this high-risk pregnancy and has GDM (1st trimester dx); History of preterm premature rupture of membranes (PROM) in previous pregnancy, currently pregnant in second trimester; Short interval between pregnancies affecting pregnancy in second trimester, antepartum; ASCUS with positive high risk HPV cervical; and Supervision of high risk pregnancy in second trimester on her problem list.  Patient reports no complaints.  Contractions: Not present. Vag. Bleeding: None.  Movement: Present. Denies leaking of fluid.   The following portions of the patient's history were reviewed and updated as appropriate: allergies, current medications, past family history, past medical history, past social history, past surgical history and problem list. Problem list updated.  Objective:   Vitals:   01/13/17 1124  BP: 106/60  Pulse: 78  Weight: 99 lb 12.8 oz (45.3 kg)    Fetal Status: Fetal Heart Rate (bpm): 147 Fundal Height: 27 cm Movement: Present     General:  Alert, oriented and cooperative. Patient is in no acute distress.  Skin: Skin is warm and dry. No rash noted.   Cardiovascular: Normal heart rate noted  Respiratory: Normal respiratory effort, no problems with respiration noted  Abdomen: Soft, gravid, appropriate for gestational age. Pain/Pressure: Absent     Pelvic:  Cervical exam deferred        Extremities: Normal range of motion.  Edema: None  Mental Status: Normal mood and affect. Normal behavior. Normal judgment and thought content.  FBS 82-108 (6 of 14 out of range) 2 hour pp 90-147 (all lunch are out of range) Assessment and Plan:  Pregnancy: G2P0101 at 5918w5d  1. History of preterm premature rupture of membranes (PROM) in previous pregnancy, currently pregnant in second trimester Weekly Makena -  hydroxyprogesterone caproate (MAKENA) 250 mg/mL injection 250 mg; Inject 1 mL (250 mg total) into the muscle once a week.  2. Supervision of high risk pregnancy in second trimester - CBC - RPR - HIV antibody (with reflex) - Flu Vaccine QUAD 36+ mos IM (Fluarix, Quad PF) - Tdap vaccine greater than or equal to 7yo IM  3. Gestational diabetes mellitus (GDM) in third trimester controlled on oral hypoglycemic drug Add glyburide due to Glucophage - glyBURIDE (DIABETA) 2.5 MG tablet; Take 0.5 tablets (1.25 mg total) by mouth every morning.  Dispense: 60 tablet; Refill: 3  4. Short interval between pregnancies affecting pregnancy in second trimester, antepartum  Preterm labor symptoms and general obstetric precautions including but not limited to vaginal bleeding, contractions, leaking of fluid and fetal movement were reviewed in detail with the patient. Please refer to After Visit Summary for other counseling recommendations.  Return in 2 weeks (on 01/27/2017) for 17 P weekly.   Reva Boresanya S Darnesha Diloreto, MD

## 2017-01-14 ENCOUNTER — Encounter (HOSPITAL_COMMUNITY): Payer: Self-pay

## 2017-01-14 ENCOUNTER — Other Ambulatory Visit (HOSPITAL_COMMUNITY): Payer: Self-pay | Admitting: *Deleted

## 2017-01-14 ENCOUNTER — Ambulatory Visit (HOSPITAL_COMMUNITY)
Admission: RE | Admit: 2017-01-14 | Discharge: 2017-01-14 | Disposition: A | Discharge status: Home / Self Care | Payer: Medicaid Other | Source: Ambulatory Visit | Attending: Obstetrics and Gynecology | Admitting: Obstetrics and Gynecology

## 2017-01-14 ENCOUNTER — Other Ambulatory Visit (HOSPITAL_COMMUNITY): Payer: Self-pay | Admitting: Maternal and Fetal Medicine

## 2017-01-14 DIAGNOSIS — O24415 Gestational diabetes mellitus in pregnancy, controlled by oral hypoglycemic drugs: Secondary | ICD-10-CM | POA: Diagnosis not present

## 2017-01-14 DIAGNOSIS — O09893 Supervision of other high risk pregnancies, third trimester: Secondary | ICD-10-CM | POA: Insufficient documentation

## 2017-01-14 DIAGNOSIS — Z362 Encounter for other antenatal screening follow-up: Secondary | ICD-10-CM | POA: Diagnosis not present

## 2017-01-14 DIAGNOSIS — O09213 Supervision of pregnancy with history of pre-term labor, third trimester: Secondary | ICD-10-CM | POA: Diagnosis present

## 2017-01-14 DIAGNOSIS — Z3A28 28 weeks gestation of pregnancy: Secondary | ICD-10-CM | POA: Insufficient documentation

## 2017-01-14 LAB — RPR

## 2017-01-20 ENCOUNTER — Ambulatory Visit: Payer: Self-pay

## 2017-01-20 ENCOUNTER — Ambulatory Visit (INDEPENDENT_AMBULATORY_CARE_PROVIDER_SITE_OTHER): Payer: Medicaid Other

## 2017-01-20 VITALS — BP 104/85 | HR 74

## 2017-01-20 DIAGNOSIS — O09212 Supervision of pregnancy with history of pre-term labor, second trimester: Secondary | ICD-10-CM | POA: Diagnosis not present

## 2017-01-20 DIAGNOSIS — O09892 Supervision of other high risk pregnancies, second trimester: Secondary | ICD-10-CM

## 2017-01-20 MED ORDER — HYDROXYPROGESTERONE CAPROATE 250 MG/ML IM OIL
250.0000 mg | TOPICAL_OIL | Freq: Once | INTRAMUSCULAR | Status: AC
  Administered 2017-01-20: 250 mg via INTRAMUSCULAR

## 2017-01-27 ENCOUNTER — Encounter: Payer: Self-pay | Admitting: Family Medicine

## 2017-02-04 ENCOUNTER — Ambulatory Visit (INDEPENDENT_AMBULATORY_CARE_PROVIDER_SITE_OTHER): Payer: Medicaid Other | Admitting: Family

## 2017-02-04 VITALS — BP 95/54 | HR 81 | Wt 99.5 lb

## 2017-02-04 DIAGNOSIS — O0992 Supervision of high risk pregnancy, unspecified, second trimester: Secondary | ICD-10-CM

## 2017-02-04 DIAGNOSIS — O24415 Gestational diabetes mellitus in pregnancy, controlled by oral hypoglycemic drugs: Secondary | ICD-10-CM

## 2017-02-04 DIAGNOSIS — O09293 Supervision of pregnancy with other poor reproductive or obstetric history, third trimester: Secondary | ICD-10-CM

## 2017-02-04 DIAGNOSIS — O09292 Supervision of pregnancy with other poor reproductive or obstetric history, second trimester: Secondary | ICD-10-CM

## 2017-02-04 MED ORDER — HYDROXYPROGESTERONE CAPROATE 250 MG/ML IM OIL
250.0000 mg | TOPICAL_OIL | Freq: Once | INTRAMUSCULAR | Status: AC
  Administered 2017-02-04: 250 mg via INTRAMUSCULAR

## 2017-02-04 NOTE — Progress Notes (Signed)
   PRENATAL VISIT NOTE  Subjective:  Courtney Nguyen is a 26 y.o. G2P0101 at 7525w6d being seen today for ongoing prenatal care.  She is currently monitored for the following issues for this high-risk pregnancy and has GDM (1st trimester dx); History of preterm premature rupture of membranes (PROM) in previous pregnancy, currently pregnant in second trimester; Short interval between pregnancies affecting pregnancy in second trimester, antepartum; ASCUS with positive high risk HPV cervical; and Supervision of high risk pregnancy in second trimester on her problem list.  Patient reports no complaints. Pt did not bring glucose log.  Began Glyburide 2.5 mg after last visit.  Reports some after meals high 130's  .  Marland Kitchen.  Movement: Present. Denies leaking of fluid.   The following portions of the patient's history were reviewed and updated as appropriate: allergies, current medications, past family history, past medical history, past social history, past surgical history and problem list. Problem list updated.  Objective:   Vitals:   02/04/17 1020  BP: (!) 95/54  Pulse: 81  Weight: 99 lb 8 oz (45.1 kg)    Fetal Status: Fetal Heart Rate (bpm): 144 Fundal Height: 33 cm Movement: Present     General:  Alert, oriented and cooperative. Patient is in no acute distress.  Skin: Skin is warm and dry. No rash noted.   Cardiovascular: Normal heart rate noted  Respiratory: Normal respiratory effort, no problems with respiration noted  Abdomen: Soft, gravid, appropriate for gestational age. Pain/Pressure: Present     Pelvic:  Cervical exam deferred        Extremities: Normal range of motion.     Mental Status: Normal mood and affect. Normal behavior. Normal judgment and thought content.   Assessment and Plan:  Pregnancy: G2P0101 at 4225w6d  1. Gestational diabetes mellitus (GDM) in third trimester controlled on oral hypoglycemic drug - Begin fetal testing this Friday - Emphasized bring glucose every visit,  bring log this Friday for a provider review  2. History of preterm premature rupture of membranes (PROM) in previous pregnancy, currently pregnant in second trimester - 17p today  3. Supervision of high risk pregnancy in second trimester - Reviewed third trimester labs  Preterm labor symptoms and general obstetric precautions including but not limited to vaginal bleeding, contractions, leaking of fluid and fetal movement were reviewed in detail with the patient. Please refer to After Visit Summary for other counseling recommendations.  Return in about 3 days (around 02/07/2017), or NST, for NST 2/wk and appt in 2 weeks.   Eino FarberWalidah Kennith GainN Karim, CNM

## 2017-02-07 ENCOUNTER — Other Ambulatory Visit: Payer: Self-pay | Admitting: Obstetrics & Gynecology

## 2017-02-10 ENCOUNTER — Ambulatory Visit (INDEPENDENT_AMBULATORY_CARE_PROVIDER_SITE_OTHER): Payer: BLUE CROSS/BLUE SHIELD | Admitting: Obstetrics & Gynecology

## 2017-02-10 DIAGNOSIS — O09213 Supervision of pregnancy with history of pre-term labor, third trimester: Secondary | ICD-10-CM | POA: Diagnosis not present

## 2017-02-10 DIAGNOSIS — O24415 Gestational diabetes mellitus in pregnancy, controlled by oral hypoglycemic drugs: Secondary | ICD-10-CM | POA: Diagnosis not present

## 2017-02-10 DIAGNOSIS — O0992 Supervision of high risk pregnancy, unspecified, second trimester: Secondary | ICD-10-CM

## 2017-02-10 DIAGNOSIS — O09893 Supervision of other high risk pregnancies, third trimester: Secondary | ICD-10-CM

## 2017-02-10 DIAGNOSIS — O09892 Supervision of other high risk pregnancies, second trimester: Secondary | ICD-10-CM

## 2017-02-10 NOTE — Progress Notes (Signed)
   PRENATAL VISIT NOTE  Subjective:  Peter CongoBich Cyndie Chimeguyen is a 26 y.o. G2P0101 at 7068w5d being seen today for ongoing prenatal care.  She is currently monitored for the following issues for this high-risk pregnancy and has GDM (1st trimester dx); History of preterm premature rupture of membranes (PROM) in previous pregnancy, currently pregnant in second trimester; Short interval between pregnancies affecting pregnancy in second trimester, antepartum; ASCUS with positive high risk HPV cervical; and Supervision of high risk pregnancy in second trimester on her problem list.  Patient reports cramping.   .  .   . Denies leaking of fluid.   The following portions of the patient's history were reviewed and updated as appropriate: allergies, current medications, past family history, past medical history, past social history, past surgical history and problem list. Problem list updated.  Objective:  There were no vitals filed for this visit.  Fetal Status:           General:  Alert, oriented and cooperative. Patient is in no acute distress.  Skin: Skin is warm and dry. No rash noted.   Cardiovascular: Normal heart rate noted  Respiratory: Normal respiratory effort, no problems with respiration noted  Abdomen: Soft, gravid, appropriate for gestational age.       Pelvic:  Cervical exam performed      closed and long (internal os closed)  Extremities: Normal range of motion.     Mental Status: Normal mood and affect. Normal behavior. Normal judgment and thought content.   Assessment and Plan:  Pregnancy: G2P0101 at 1068w5d  1. Gestational diabetes mellitus (GDM) in third trimester controlled on oral hypoglycemic drug - Pt did not bring CBG log, was normal at last visit and has upcoming visit with MD this week. - Fetal nonstress test  2. History of preterm delivery, currently pregnant in third trimester -Cervix closed.  No evidence of preterm labor.   Preterm labor symptoms and general obstetric  precautions including but not limited to vaginal bleeding, contractions, leaking of fluid and fetal movement were reviewed in detail with the patient. Please refer to After Visit Summary for other counseling recommendations.  Return in about 3 days (around 02/13/2017) for as scheduled.   Lesly DukesKelly H Leggett, MD

## 2017-02-11 ENCOUNTER — Other Ambulatory Visit (HOSPITAL_COMMUNITY): Payer: Self-pay | Admitting: *Deleted

## 2017-02-11 ENCOUNTER — Ambulatory Visit (HOSPITAL_COMMUNITY)
Admission: RE | Admit: 2017-02-11 | Discharge: 2017-02-11 | Disposition: A | Discharge status: Home / Self Care | Payer: Medicaid Other | Source: Ambulatory Visit | Attending: Obstetrics and Gynecology | Admitting: Obstetrics and Gynecology

## 2017-02-11 ENCOUNTER — Other Ambulatory Visit (HOSPITAL_COMMUNITY): Payer: Self-pay | Admitting: Maternal and Fetal Medicine

## 2017-02-11 ENCOUNTER — Encounter (HOSPITAL_COMMUNITY): Payer: Self-pay

## 2017-02-11 DIAGNOSIS — Z3A32 32 weeks gestation of pregnancy: Secondary | ICD-10-CM | POA: Diagnosis not present

## 2017-02-11 DIAGNOSIS — O09213 Supervision of pregnancy with history of pre-term labor, third trimester: Secondary | ICD-10-CM | POA: Insufficient documentation

## 2017-02-11 DIAGNOSIS — O24415 Gestational diabetes mellitus in pregnancy, controlled by oral hypoglycemic drugs: Secondary | ICD-10-CM | POA: Diagnosis not present

## 2017-02-11 DIAGNOSIS — Z362 Encounter for other antenatal screening follow-up: Secondary | ICD-10-CM

## 2017-02-11 DIAGNOSIS — O09893 Supervision of other high risk pregnancies, third trimester: Secondary | ICD-10-CM | POA: Diagnosis not present

## 2017-02-13 ENCOUNTER — Ambulatory Visit (INDEPENDENT_AMBULATORY_CARE_PROVIDER_SITE_OTHER): Payer: BLUE CROSS/BLUE SHIELD | Admitting: Obstetrics and Gynecology

## 2017-02-13 VITALS — BP 96/62 | HR 110

## 2017-02-13 DIAGNOSIS — O24415 Gestational diabetes mellitus in pregnancy, controlled by oral hypoglycemic drugs: Secondary | ICD-10-CM | POA: Diagnosis not present

## 2017-02-17 ENCOUNTER — Ambulatory Visit: Payer: Self-pay

## 2017-02-17 ENCOUNTER — Ambulatory Visit (INDEPENDENT_AMBULATORY_CARE_PROVIDER_SITE_OTHER): Payer: BLUE CROSS/BLUE SHIELD | Admitting: Obstetrics & Gynecology

## 2017-02-17 VITALS — BP 104/50 | HR 69 | Wt 99.5 lb

## 2017-02-17 DIAGNOSIS — O24415 Gestational diabetes mellitus in pregnancy, controlled by oral hypoglycemic drugs: Secondary | ICD-10-CM

## 2017-02-17 DIAGNOSIS — Z3689 Encounter for other specified antenatal screening: Secondary | ICD-10-CM

## 2017-02-17 DIAGNOSIS — O0992 Supervision of high risk pregnancy, unspecified, second trimester: Secondary | ICD-10-CM

## 2017-02-17 LAB — GLUCOSE, CAPILLARY: Glucose-Capillary: 75 mg/dL (ref 65–99)

## 2017-02-17 NOTE — Progress Notes (Signed)
2/22 NST reviewed and reactive

## 2017-02-17 NOTE — Progress Notes (Signed)
Pt states she is checking CBG 2-3 times daily. US for growth scheduled 3/20.    PRENATAL VISIT NOTE  Subjective:  Peter CongoBich Cyndie Chimeguyen is a 26 y.o. G2P0101 at 2956w5d being seen today for ongoing prenatal care.  She is currently monitored for the following issues for this high-risk pregnancy and has GDM (1st trimester dx); History of preterm premature rupture of membranes (PROM) in previous pregnancy, currently pregnant in second trimester; Short interval between pregnancies affecting pregnancy in second trimester, antepartum; ASCUS with positive high risk HPV cervical; and Supervision of high risk pregnancy in second trimester on her problem list.  Patient reports no complaints.  Contractions: Not present. Vag. Bleeding: None.  Movement: Present. Denies leaking of fluid.   The following portions of the patient's history were reviewed and updated as appropriate: allergies, current medications, past family history, past medical history, past social history, past surgical history and problem list. Problem list updated.  Objective:   Vitals:   02/17/17 0806  BP: (!) 104/50  Pulse: 69  Weight: 99 lb 8 oz (45.1 kg)    Fetal Status: Fetal Heart Rate (bpm): NST   Movement: Present  Presentation: Vertex  General:  Alert, oriented and cooperative. Patient is in no acute distress.  Skin: Skin is warm and dry. No rash noted.   Cardiovascular: Normal heart rate noted  Respiratory: Normal respiratory effort, no problems with respiration noted  Abdomen: Soft, gravid, appropriate for gestational age. Pain/Pressure: Absent     Pelvic:  Cervical exam deferred        Extremities: Normal range of motion.  Edema: None  Mental Status: Normal mood and affect. Normal behavior. Normal judgment and thought content.   Assessment and Plan:  Pregnancy: G2P0101 at 9156w5d  1. Gestational diabetes mellitus (GDM) in third trimester controlled on oral hypoglycemic drug - Fetal nonstress test - US OB Limited -Pt reports  nml sugars.  Did not bring log.  Fasting 75 today -Continue meds  2. Supervision of high risk pregnancy in second trimester -17P until 36 weeks.  Preterm labor symptoms and general obstetric precautions including but not limited to vaginal bleeding, contractions, leaking of fluid and fetal movement were reviewed in detail with the patient. Please refer to After Visit Summary for other counseling recommendations.  Return in about 2 years (around 02/17/2019) for 2x/wk as scheduled.   Lesly DukesKelly H Jennah Satchell, MD

## 2017-02-17 NOTE — Progress Notes (Signed)
Pt informed that the ultrasound is considered a limited OB ultrasound and is not intended to be a complete ultrasound exam.  Patient also informed that the ultrasound is not being completed with the intent of assessing for fetal or placental anomalies or any pelvic abnormalities.  Explained that the purpose of today's ultrasound is to assess for presentation and amniotic fluid volume.  Patient acknowledges the purpose of the exam and the limitations of the study.    

## 2017-02-20 ENCOUNTER — Ambulatory Visit (INDEPENDENT_AMBULATORY_CARE_PROVIDER_SITE_OTHER): Payer: BLUE CROSS/BLUE SHIELD | Admitting: Obstetrics & Gynecology

## 2017-02-20 VITALS — BP 113/55 | HR 79

## 2017-02-20 DIAGNOSIS — O24415 Gestational diabetes mellitus in pregnancy, controlled by oral hypoglycemic drugs: Secondary | ICD-10-CM

## 2017-02-24 ENCOUNTER — Other Ambulatory Visit: Payer: Self-pay | Admitting: Obstetrics & Gynecology

## 2017-02-25 ENCOUNTER — Ambulatory Visit (INDEPENDENT_AMBULATORY_CARE_PROVIDER_SITE_OTHER): Payer: Medicaid Other | Admitting: Obstetrics and Gynecology

## 2017-02-25 ENCOUNTER — Ambulatory Visit: Payer: Self-pay

## 2017-02-25 VITALS — BP 108/55 | HR 56 | Wt 101.0 lb

## 2017-02-25 DIAGNOSIS — O24415 Gestational diabetes mellitus in pregnancy, controlled by oral hypoglycemic drugs: Secondary | ICD-10-CM

## 2017-02-25 DIAGNOSIS — O09213 Supervision of pregnancy with history of pre-term labor, third trimester: Secondary | ICD-10-CM

## 2017-02-25 DIAGNOSIS — Z3689 Encounter for other specified antenatal screening: Secondary | ICD-10-CM

## 2017-02-25 DIAGNOSIS — O0993 Supervision of high risk pregnancy, unspecified, third trimester: Secondary | ICD-10-CM

## 2017-02-25 DIAGNOSIS — O09893 Supervision of other high risk pregnancies, third trimester: Secondary | ICD-10-CM

## 2017-02-25 MED ORDER — GLYBURIDE 2.5 MG PO TABS: 1.2500 mg | ORAL_TABLET | Freq: Two times a day (BID) | ORAL | 3 refills | Status: DC

## 2017-02-25 NOTE — Patient Instructions (Signed)
Third Trimester of Pregnancy The third trimester is from week 28 through week 40 (months 7 through 9). The third trimester is a time when the unborn baby (fetus) is growing rapidly. At the end of the ninth month, the fetus is about 20 inches in length and weighs 6-10 pounds. Body changes during your third trimester Your body will continue to go through many changes during pregnancy. The changes vary from woman to woman. During the third trimester:  Your weight will continue to increase. You can expect to gain 25-35 pounds (11-16 kg) by the end of the pregnancy.  You may begin to get stretch marks on your hips, abdomen, and breasts.  You may urinate more often because the fetus is moving lower into your pelvis and pressing on your bladder.  You may develop or continue to have heartburn. This is caused by increased hormones that slow down muscles in the digestive tract.  You may develop or continue to have constipation because increased hormones slow digestion and cause the muscles that push waste through your intestines to relax.  You may develop hemorrhoids. These are swollen veins (varicose veins) in the rectum that can itch or be painful.  You may develop swollen, bulging veins (varicose veins) in your legs.  You may have increased body aches in the pelvis, back, or thighs. This is due to weight gain and increased hormones that are relaxing your joints.  You may have changes in your hair. These can include thickening of your hair, rapid growth, and changes in texture. Some women also have hair loss during or after pregnancy, or hair that feels dry or thin. Your hair will most likely return to normal after your baby is born.  Your breasts will continue to grow and they will continue to become tender. A yellow fluid (colostrum) may leak from your breasts. This is the first milk you are producing for your baby.  Your belly button may stick out.  You may notice more swelling in your hands,  face, or ankles.  You may have increased tingling or numbness in your hands, arms, and legs. The skin on your belly may also feel numb.  You may feel short of breath because of your expanding uterus.  You may have more problems sleeping. This can be caused by the size of your belly, increased need to urinate, and an increase in your body's metabolism.  You may notice the fetus "dropping," or moving lower in your abdomen (lightening).  You may have increased vaginal discharge.  You may notice your joints feel loose and you may have pain around your pelvic bone.  What to expect at prenatal visits You will have prenatal exams every 2 weeks until week 36. Then you will have weekly prenatal exams. During a routine prenatal visit:  You will be weighed to make sure you and the baby are growing normally.  Your blood pressure will be taken.  Your abdomen will be measured to track your baby's growth.  The fetal heartbeat will be listened to.  Any test results from the previous visit will be discussed.  You may have a cervical check near your due date to see if your cervix has softened or thinned (effaced).  You will be tested for Group B streptococcus. This happens between 35 and 37 weeks.  Your health care provider may ask you:  What your birth plan is.  How you are feeling.  If you are feeling the baby move.  If you have had   any abnormal symptoms, such as leaking fluid, bleeding, severe headaches, or abdominal cramping.  If you are using any tobacco products, including cigarettes, chewing tobacco, and electronic cigarettes.  If you have any questions.  Other tests or screenings that may be performed during your third trimester include:  Blood tests that check for low iron levels (anemia).  Fetal testing to check the health, activity level, and growth of the fetus. Testing is done if you have certain medical conditions or if there are problems during the  pregnancy.  Nonstress test (NST). This test checks the health of your baby to make sure there are no signs of problems, such as the baby not getting enough oxygen. During this test, a belt is placed around your belly. The baby is made to move, and its heart rate is monitored during movement.  What is false labor? False labor is a condition in which you feel small, irregular tightenings of the muscles in the womb (contractions) that usually go away with rest, changing position, or drinking water. These are called Braxton Hicks contractions. Contractions may last for hours, days, or even weeks before true labor sets in. If contractions come at regular intervals, become more frequent, increase in intensity, or become painful, you should see your health care provider. What are the signs of labor?  Abdominal cramps.  Regular contractions that start at 10 minutes apart and become stronger and more frequent with time.  Contractions that start on the top of the uterus and spread down to the lower abdomen and back.  Increased pelvic pressure and dull back pain.  A watery or bloody mucus discharge that comes from the vagina.  Leaking of amniotic fluid. This is also known as your "water breaking." It could be a slow trickle or a gush. Let your health care provider know if it has a color or strange odor. If you have any of these signs, call your health care provider right away, even if it is before your due date. Follow these instructions at home: Medicines  Follow your health care provider's instructions regarding medicine use. Specific medicines may be either safe or unsafe to take during pregnancy.  Take a prenatal vitamin that contains at least 600 micrograms (mcg) of folic acid.  If you develop constipation, try taking a stool softener if your health care provider approves. Eating and drinking  Eat a balanced diet that includes fresh fruits and vegetables, whole grains, good sources of protein  such as meat, eggs, or tofu, and low-fat dairy. Your health care provider will help you determine the amount of weight gain that is right for you.  Avoid raw meat and uncooked cheese. These carry germs that can cause birth defects in the baby.  If you have low calcium intake from food, talk to your health care provider about whether you should take a daily calcium supplement.  Eat four or five small meals rather than three large meals a day.  Limit foods that are high in fat and processed sugars, such as fried and sweet foods.  To prevent constipation: ? Drink enough fluid to keep your urine clear or pale yellow. ? Eat foods that are high in fiber, such as fresh fruits and vegetables, whole grains, and beans. Activity  Exercise only as directed by your health care provider. Most women can continue their usual exercise routine during pregnancy. Try to exercise for 30 minutes at least 5 days a week. Stop exercising if you experience uterine contractions.  Avoid heavy   lifting.  Do not exercise in extreme heat or humidity, or at high altitudes.  Wear low-heel, comfortable shoes.  Practice good posture.  You may continue to have sex unless your health care provider tells you otherwise. Relieving pain and discomfort  Take frequent breaks and rest with your legs elevated if you have leg cramps or low back pain.  Take warm sitz baths to soothe any pain or discomfort caused by hemorrhoids. Use hemorrhoid cream if your health care provider approves.  Wear a good support bra to prevent discomfort from breast tenderness.  If you develop varicose veins: ? Wear support pantyhose or compression stockings as told by your healthcare provider. ? Elevate your feet for 15 minutes, 3-4 times a day. Prenatal care  Write down your questions. Take them to your prenatal visits.  Keep all your prenatal visits as told by your health care provider. This is important. Safety  Wear your seat belt at  all times when driving.  Make a list of emergency phone numbers, including numbers for family, friends, the hospital, and police and fire departments. General instructions  Avoid cat litter boxes and soil used by cats. These carry germs that can cause birth defects in the baby. If you have a cat, ask someone to clean the litter box for you.  Do not travel far distances unless it is absolutely necessary and only with the approval of your health care provider.  Do not use hot tubs, steam rooms, or saunas.  Do not drink alcohol.  Do not use any products that contain nicotine or tobacco, such as cigarettes and e-cigarettes. If you need help quitting, ask your health care provider.  Do not use any medicinal herbs or unprescribed drugs. These chemicals affect the formation and growth of the baby.  Do not douche or use tampons or scented sanitary pads.  Do not cross your legs for long periods of time.  To prepare for the arrival of your baby: ? Take prenatal classes to understand, practice, and ask questions about labor and delivery. ? Make a trial run to the hospital. ? Visit the hospital and tour the maternity area. ? Arrange for maternity or paternity leave through employers. ? Arrange for family and friends to take care of pets while you are in the hospital. ? Purchase a rear-facing car seat and make sure you know how to install it in your car. ? Pack your hospital bag. ? Prepare the baby's nursery. Make sure to remove all pillows and stuffed animals from the baby's crib to prevent suffocation.  Visit your dentist if you have not gone during your pregnancy. Use a soft toothbrush to brush your teeth and be gentle when you floss. Contact a health care provider if:  You are unsure if you are in labor or if your water has broken.  You become dizzy.  You have mild pelvic cramps, pelvic pressure, or nagging pain in your abdominal area.  You have lower back pain.  You have persistent  nausea, vomiting, or diarrhea.  You have an unusual or bad smelling vaginal discharge.  You have pain when you urinate. Get help right away if:  Your water breaks before 37 weeks.  You have regular contractions less than 5 minutes apart before 37 weeks.  You have a fever.  You are leaking fluid from your vagina.  You have spotting or bleeding from your vagina.  You have severe abdominal pain or cramping.  You have rapid weight loss or weight gain.    You have shortness of breath with chest pain.  You notice sudden or extreme swelling of your face, hands, ankles, feet, or legs.  Your baby makes fewer than 10 movements in 2 hours.  You have severe headaches that do not go away when you take medicine.  You have vision changes. Summary  The third trimester is from week 28 through week 40, months 7 through 9. The third trimester is a time when the unborn baby (fetus) is growing rapidly.  During the third trimester, your discomfort may increase as you and your baby continue to gain weight. You may have abdominal, leg, and back pain, sleeping problems, and an increased need to urinate.  During the third trimester your breasts will keep growing and they will continue to become tender. A yellow fluid (colostrum) may leak from your breasts. This is the first milk you are producing for your baby.  False labor is a condition in which you feel small, irregular tightenings of the muscles in the womb (contractions) that eventually go away. These are called Braxton Hicks contractions. Contractions may last for hours, days, or even weeks before true labor sets in.  Signs of labor can include: abdominal cramps; regular contractions that start at 10 minutes apart and become stronger and more frequent with time; watery or bloody mucus discharge that comes from the vagina; increased pelvic pressure and dull back pain; and leaking of amniotic fluid. This information is not intended to replace advice  given to you by your health care provider. Make sure you discuss any questions you have with your health care provider. Document Released: 12/03/2001 Document Revised: 05/16/2016 Document Reviewed: 02/09/2013 Elsevier Interactive Patient Education  2017 [employer]  

## 2017-02-25 NOTE — Progress Notes (Addendum)
Pt informed that the ultrasound is considered a limited OB ultrasound and is not intended to be a complete ultrasound exam.  Patient also informed that the ultrasound is not being completed with the intent of assessing for fetal or placental anomalies or any pelvic abnormalities.  Explained that the purpose of today's ultrasound is to assess for presentation and amniotic fluid volume.  Patient acknowledges the purpose of the exam and the limitations of the study.    Pt states she is only checking CBG twice daily.  US for growth scheduled 3/19.

## 2017-02-25 NOTE — Progress Notes (Signed)
   PRENATAL VISIT NOTE  Subjective:  Peter CongoBich Cyndie Chimeguyen is a 26 y.o. G2P0101 at 7968w6d being seen today for ongoing prenatal care.  She is currently monitored for the following issues for this high-risk pregnancy and has GDM (1st trimester dx); History of preterm premature rupture of membranes (PROM) in previous pregnancy, currently pregnant in second trimester; Short interval between pregnancies affecting pregnancy in second trimester, antepartum; ASCUS with positive high risk HPV cervical; and Supervision of high risk pregnancy in second trimester on her problem list.  Patient reports no complaints.  Contractions: Not present. Vag. Bleeding: None.  Movement: Present. Denies leaking of fluid.   The following portions of the patient's history were reviewed and updated as appropriate: allergies, current medications, past family history, past medical history, past social history, past surgical history and problem list. Problem list updated.  Objective:   Vitals:   02/25/17 0803  BP: (!) 108/55  Pulse: (!) 56  Weight: 101 lb (45.8 kg)    Fetal Status: Fetal Heart Rate (bpm): NST   Movement: Present     General:  Alert, oriented and cooperative. Patient is in no acute distress.  Skin: Skin is warm and dry. No rash noted.   Cardiovascular: Normal heart rate noted  Respiratory: Normal respiratory effort, no problems with respiration noted  Abdomen: Soft, gravid, appropriate for gestational age. Pain/Pressure: Absent     Pelvic:  Cervical exam deferred        Extremities: Normal range of motion.  Edema: None  Mental Status: Normal mood and affect. Normal behavior. Normal judgment and thought content.   Assessment and Plan:  Pregnancy: G2P0101 at 2468w6d  1. Gestational diabetes mellitus (GDM) in third trimester controlled on oral hypoglycemic drug Sugars have been in the upper 90's fasting in AM over the past couple of weeks. Will add glyburide at bet time as well. Having some sugars 130/140's post  prandial as well. Will have patient see provider next week to check to see if glyburide or metformin needs to be increased again. Encouraged patient to be sure to check sugar fasting and 2 hours post prandial.  - Fetal nonstress test - reactive today 145/mod var/reactive - US OB Limited; Future - scheduled 3/19 - increase  To glyBURIDE (DIABETA) 2.5 MG tablet; Take 0.5 tablets (1.25 mg total) by mouth 2 (two) times daily.  Dispense: 60 tablet; Refill: 3  2. Supervision of high risk pregnancy in third trimester -up to date today -continue 17-P until 36 wks.   Preterm labor symptoms and general obstetric precautions including but not limited to vaginal bleeding, contractions, leaking of fluid and fetal movement were reviewed in detail with the patient. Please refer to After Visit Summary for other counseling recommendations.  No Follow-up on file.   Lorne SkeensNicholas Michael Tyjay Galindo, MD

## 2017-02-27 ENCOUNTER — Ambulatory Visit (INDEPENDENT_AMBULATORY_CARE_PROVIDER_SITE_OTHER): Payer: BLUE CROSS/BLUE SHIELD | Admitting: Obstetrics & Gynecology

## 2017-02-27 VITALS — BP 103/50 | HR 83

## 2017-02-27 DIAGNOSIS — O24415 Gestational diabetes mellitus in pregnancy, controlled by oral hypoglycemic drugs: Secondary | ICD-10-CM | POA: Diagnosis not present

## 2017-02-27 NOTE — Progress Notes (Signed)
NST 02/27/17 reactive

## 2017-03-03 ENCOUNTER — Ambulatory Visit (INDEPENDENT_AMBULATORY_CARE_PROVIDER_SITE_OTHER): Payer: Medicaid Other | Admitting: Obstetrics and Gynecology

## 2017-03-03 ENCOUNTER — Other Ambulatory Visit (HOSPITAL_COMMUNITY)
Admission: RE | Admit: 2017-03-03 | Discharge: 2017-03-03 | Disposition: A | Discharge status: Home / Self Care | Payer: BLUE CROSS/BLUE SHIELD | Source: Ambulatory Visit | Attending: Obstetrics and Gynecology | Admitting: Obstetrics and Gynecology

## 2017-03-03 ENCOUNTER — Ambulatory Visit: Payer: Self-pay

## 2017-03-03 VITALS — BP 106/56 | HR 78 | Wt 101.5 lb

## 2017-03-03 DIAGNOSIS — Z3689 Encounter for other specified antenatal screening: Secondary | ICD-10-CM

## 2017-03-03 DIAGNOSIS — O0993 Supervision of high risk pregnancy, unspecified, third trimester: Secondary | ICD-10-CM

## 2017-03-03 DIAGNOSIS — O09213 Supervision of pregnancy with history of pre-term labor, third trimester: Secondary | ICD-10-CM

## 2017-03-03 DIAGNOSIS — Z113 Encounter for screening for infections with a predominantly sexual mode of transmission: Secondary | ICD-10-CM | POA: Insufficient documentation

## 2017-03-03 DIAGNOSIS — O24415 Gestational diabetes mellitus in pregnancy, controlled by oral hypoglycemic drugs: Secondary | ICD-10-CM

## 2017-03-03 DIAGNOSIS — O09893 Supervision of other high risk pregnancies, third trimester: Secondary | ICD-10-CM

## 2017-03-03 LAB — OB RESULTS CONSOLE GBS: STREP GROUP B AG: NEGATIVE

## 2017-03-03 NOTE — Progress Notes (Signed)
Subjective:  Courtney Nguyen is a 26 y.o. G2P0101 at 2654w5d being seen today for ongoing prenatal care.  She is currently monitored for the following issues for this high-risk pregnancy and has GDM (1st trimester dx); History of preterm premature rupture of membranes (PROM) in previous pregnancy, currently pregnant in second trimester; Short interval between pregnancies affecting pregnancy in second trimester, antepartum; ASCUS with positive high risk HPV cervical; and Supervision of high risk pregnancy in second trimester on her problem list.  Patient reports no complaints.  Contractions: Not present. Vag. Bleeding: None.  Movement: Present. Denies leaking of fluid.   The following portions of the patient's history were reviewed and updated as appropriate: allergies, current medications, past family history, past medical history, past social history, past surgical history and problem list. Problem list updated.  Objective:   Vitals:   03/03/17 0755  BP: (!) 106/56  Pulse: 78  Weight: 101 lb 8 oz (46 kg)    Fetal Status: Fetal Heart Rate (bpm): NST   Movement: Present  Presentation: Vertex  General:  Alert, oriented and cooperative. Patient is in no acute distress.  Skin: Skin is warm and dry. No rash noted.   Cardiovascular: Normal heart rate noted  Respiratory: Normal respiratory effort, no problems with respiration noted  Abdomen: Soft, gravid, appropriate for gestational age. Pain/Pressure: Absent     Pelvic:  Cervical exam deferred        Extremities: Normal range of motion.  Edema: None  Mental Status: Normal mood and affect. Normal behavior. Normal judgment and thought content.   Urinalysis:      Assessment and Plan:  Pregnancy: G2P0101 at 6154w5d  1. Gestational diabetes mellitus (GDM) in third trimester controlled on oral hypoglycemic drug BS better controlled with increase of glyburide Reactive NST today - Fetal nonstress test - US OB Limited; Future  2. Supervision of high  risk pregnancy in third trimester  - GC/Chlamydia probe amp ([place] - Strep Gp B NAA  3. History of preterm delivery, currently pregnant in third trimester 17 OHP  Preterm labor symptoms and general obstetric precautions including but not limited to vaginal bleeding, contractions, leaking of fluid and fetal movement were reviewed in detail with the patient. Please refer to After Visit Summary for other counseling recommendations.  Return in about 1 week (around 03/10/2017) for 2x/wk as scheduled, OB visit.   Hermina StaggersMichael L Debbrah Sampedro, MD

## 2017-03-03 NOTE — Progress Notes (Addendum)
Pt informed that the ultrasound is considered a limited OB ultrasound and is not intended to be a complete ultrasound exam.  Patient also informed that the ultrasound is not being completed with the intent of assessing for fetal or placental anomalies or any pelvic abnormalities.  Explained that the purpose of today's ultrasound is to assess for presentation and amniotic fluid volume.  Patient acknowledges the purpose of the exam and the limitations of the study.    Pt encouraged to increase po fluid/H20 intake. US for growth scheduled 3/19

## 2017-03-04 ENCOUNTER — Encounter (HOSPITAL_COMMUNITY): Payer: Self-pay | Admitting: Obstetrics and Gynecology

## 2017-03-04 LAB — GC/CHLAMYDIA PROBE AMP (~~LOC~~) NOT AT ARMC
CHLAMYDIA, DNA PROBE: NEGATIVE
Neisseria Gonorrhea: NEGATIVE

## 2017-03-05 LAB — STREP GP B NAA: Strep Gp B NAA: NEGATIVE

## 2017-03-06 ENCOUNTER — Ambulatory Visit (INDEPENDENT_AMBULATORY_CARE_PROVIDER_SITE_OTHER): Payer: BLUE CROSS/BLUE SHIELD | Admitting: Obstetrics & Gynecology

## 2017-03-06 VITALS — BP 105/51 | HR 72

## 2017-03-06 DIAGNOSIS — O24415 Gestational diabetes mellitus in pregnancy, controlled by oral hypoglycemic drugs: Secondary | ICD-10-CM

## 2017-03-06 NOTE — Progress Notes (Signed)
US for growth scheduled 3/19  NST reactive on 03/06/17.  F/U with MD for regular visit as scheduled.

## 2017-03-10 ENCOUNTER — Ambulatory Visit (INDEPENDENT_AMBULATORY_CARE_PROVIDER_SITE_OTHER): Payer: BLUE CROSS/BLUE SHIELD | Admitting: Family Medicine

## 2017-03-10 ENCOUNTER — Encounter (HOSPITAL_COMMUNITY): Payer: Self-pay

## 2017-03-10 ENCOUNTER — Ambulatory Visit (HOSPITAL_COMMUNITY)
Admission: RE | Admit: 2017-03-10 | Discharge: 2017-03-10 | Disposition: A | Discharge status: Home / Self Care | Payer: Medicaid Other | Source: Ambulatory Visit | Attending: Obstetrics and Gynecology | Admitting: Obstetrics and Gynecology

## 2017-03-10 ENCOUNTER — Other Ambulatory Visit (HOSPITAL_COMMUNITY): Payer: Self-pay | Admitting: Maternal and Fetal Medicine

## 2017-03-10 VITALS — BP 110/63 | HR 77 | Wt 101.1 lb

## 2017-03-10 DIAGNOSIS — Z3A36 36 weeks gestation of pregnancy: Secondary | ICD-10-CM | POA: Insufficient documentation

## 2017-03-10 DIAGNOSIS — O09893 Supervision of other high risk pregnancies, third trimester: Secondary | ICD-10-CM

## 2017-03-10 DIAGNOSIS — O09213 Supervision of pregnancy with history of pre-term labor, third trimester: Secondary | ICD-10-CM

## 2017-03-10 DIAGNOSIS — O24415 Gestational diabetes mellitus in pregnancy, controlled by oral hypoglycemic drugs: Secondary | ICD-10-CM | POA: Diagnosis not present

## 2017-03-10 DIAGNOSIS — O0992 Supervision of high risk pregnancy, unspecified, second trimester: Secondary | ICD-10-CM

## 2017-03-10 DIAGNOSIS — O09293 Supervision of pregnancy with other poor reproductive or obstetric history, third trimester: Secondary | ICD-10-CM

## 2017-03-10 DIAGNOSIS — Z362 Encounter for other antenatal screening follow-up: Secondary | ICD-10-CM | POA: Insufficient documentation

## 2017-03-10 DIAGNOSIS — O09292 Supervision of pregnancy with other poor reproductive or obstetric history, second trimester: Secondary | ICD-10-CM

## 2017-03-10 DIAGNOSIS — O0993 Supervision of high risk pregnancy, unspecified, third trimester: Secondary | ICD-10-CM

## 2017-03-10 NOTE — Addendum Note (Signed)
Encounter addended by: Jason FilaMesha T Cavon Nicolls on: 03/10/2017  8:31 AM<BR>    Actions taken: Imaging Exam ended

## 2017-03-10 NOTE — Progress Notes (Signed)
US for growth done today 

## 2017-03-10 NOTE — Patient Instructions (Signed)
 Third Trimester of Pregnancy The third trimester is from week 28 through week 40 (months 7 through 9). The third trimester is a time when the unborn baby (fetus) is growing rapidly. At the end of the ninth month, the fetus is about 20 inches in length and weighs 6-10 pounds. Body changes during your third trimester Your body will continue to go through many changes during pregnancy. The changes vary from woman to woman. During the third trimester:  Your weight will continue to increase. You can expect to gain 25-35 pounds (11-16 kg) by the end of the pregnancy.  You may begin to get stretch marks on your hips, abdomen, and breasts.  You may urinate more often because the fetus is moving lower into your pelvis and pressing on your bladder.  You may develop or continue to have heartburn. This is caused by increased hormones that slow down muscles in the digestive tract.  You may develop or continue to have constipation because increased hormones slow digestion and cause the muscles that push waste through your intestines to relax.  You may develop hemorrhoids. These are swollen veins (varicose veins) in the rectum that can itch or be painful.  You may develop swollen, bulging veins (varicose veins) in your legs.  You may have increased body aches in the pelvis, back, or thighs. This is due to weight gain and increased hormones that are relaxing your joints.  You may have changes in your hair. These can include thickening of your hair, rapid growth, and changes in texture. Some women also have hair loss during or after pregnancy, or hair that feels dry or thin. Your hair will most likely return to normal after your baby is born.  Your breasts will continue to grow and they will continue to become tender. A yellow fluid (colostrum) may leak from your breasts. This is the first milk you are producing for your baby.  Your belly button may stick out.  You may notice more swelling in your  hands, face, or ankles.  You may have increased tingling or numbness in your hands, arms, and legs. The skin on your belly may also feel numb.  You may feel short of breath because of your expanding uterus.  You may have more problems sleeping. This can be caused by the size of your belly, increased need to urinate, and an increase in your body's metabolism.  You may notice the fetus "dropping," or moving lower in your abdomen (lightening).  You may have increased vaginal discharge.  You may notice your joints feel loose and you may have pain around your pelvic bone.  What to expect at prenatal visits You will have prenatal exams every 2 weeks until week 36. Then you will have weekly prenatal exams. During a routine prenatal visit:  You will be weighed to make sure you and the baby are growing normally.  Your blood pressure will be taken.  Your abdomen will be measured to track your baby's growth.  The fetal heartbeat will be listened to.  Any test results from the previous visit will be discussed.  You may have a cervical check near your due date to see if your cervix has softened or thinned (effaced).  You will be tested for Group B streptococcus. This happens between 35 and 37 weeks.  Your health care provider may ask you:  What your birth plan is.  How you are feeling.  If you are feeling the baby move.  If you have   had any abnormal symptoms, such as leaking fluid, bleeding, severe headaches, or abdominal cramping.  If you are using any tobacco products, including cigarettes, chewing tobacco, and electronic cigarettes.  If you have any questions.  Other tests or screenings that may be performed during your third trimester include:  Blood tests that check for low iron levels (anemia).  Fetal testing to check the health, activity level, and growth of the fetus. Testing is done if you have certain medical conditions or if there are problems during the  pregnancy.  Nonstress test (NST). This test checks the health of your baby to make sure there are no signs of problems, such as the baby not getting enough oxygen. During this test, a belt is placed around your belly. The baby is made to move, and its heart rate is monitored during movement.  What is false labor? False labor is a condition in which you feel small, irregular tightenings of the muscles in the womb (contractions) that usually go away with rest, changing position, or drinking water. These are called Braxton Hicks contractions. Contractions may last for hours, days, or even weeks before true labor sets in. If contractions come at regular intervals, become more frequent, increase in intensity, or become painful, you should see your health care provider. What are the signs of labor?  Abdominal cramps.  Regular contractions that start at 10 minutes apart and become stronger and more frequent with time.  Contractions that start on the top of the uterus and spread down to the lower abdomen and back.  Increased pelvic pressure and dull back pain.  A watery or bloody mucus discharge that comes from the vagina.  Leaking of amniotic fluid. This is also known as your "water breaking." It could be a slow trickle or a gush. Let your health care provider know if it has a color or strange odor. If you have any of these signs, call your health care provider right away, even if it is before your due date. Follow these instructions at home: Medicines  Follow your health care provider's instructions regarding medicine use. Specific medicines may be either safe or unsafe to take during pregnancy.  Take a prenatal vitamin that contains at least 600 micrograms (mcg) of folic acid.  If you develop constipation, try taking a stool softener if your health care provider approves. Eating and drinking  Eat a balanced diet that includes fresh fruits and vegetables, whole grains, good sources of protein  such as meat, eggs, or tofu, and low-fat dairy. Your health care provider will help you determine the amount of weight gain that is right for you.  Avoid raw meat and uncooked cheese. These carry germs that can cause birth defects in the baby.  If you have low calcium intake from food, talk to your health care provider about whether you should take a daily calcium supplement.  Eat four or five small meals rather than three large meals a day.  Limit foods that are high in fat and processed sugars, such as fried and sweet foods.  To prevent constipation: ? Drink enough fluid to keep your urine clear or pale yellow. ? Eat foods that are high in fiber, such as fresh fruits and vegetables, whole grains, and beans. Activity  Exercise only as directed by your health care provider. Most women can continue their usual exercise routine during pregnancy. Try to exercise for 30 minutes at least 5 days a week. Stop exercising if you experience uterine contractions.  Avoid   heavy lifting.  Do not exercise in extreme heat or humidity, or at high altitudes.  Wear low-heel, comfortable shoes.  Practice good posture.  You may continue to have sex unless your health care provider tells you otherwise. Relieving pain and discomfort  Take frequent breaks and rest with your legs elevated if you have leg cramps or low back pain.  Take warm sitz baths to soothe any pain or discomfort caused by hemorrhoids. Use hemorrhoid cream if your health care provider approves.  Wear a good support bra to prevent discomfort from breast tenderness.  If you develop varicose veins: ? Wear support pantyhose or compression stockings as told by your healthcare provider. ? Elevate your feet for 15 minutes, 3-4 times a day. Prenatal care  Write down your questions. Take them to your prenatal visits.  Keep all your prenatal visits as told by your health care provider. This is important. Safety  Wear your seat belt at  all times when driving.  Make a list of emergency phone numbers, including numbers for family, friends, the hospital, and police and fire departments. General instructions  Avoid cat litter boxes and soil used by cats. These carry germs that can cause birth defects in the baby. If you have a cat, ask someone to clean the litter box for you.  Do not travel far distances unless it is absolutely necessary and only with the approval of your health care provider.  Do not use hot tubs, steam rooms, or saunas.  Do not drink alcohol.  Do not use any products that contain nicotine or tobacco, such as cigarettes and e-cigarettes. If you need help quitting, ask your health care provider.  Do not use any medicinal herbs or unprescribed drugs. These chemicals affect the formation and growth of the baby.  Do not douche or use tampons or scented sanitary pads.  Do not cross your legs for long periods of time.  To prepare for the arrival of your baby: ? Take prenatal classes to understand, practice, and ask questions about labor and delivery. ? Make a trial run to the hospital. ? Visit the hospital and tour the maternity area. ? Arrange for maternity or paternity leave through employers. ? Arrange for family and friends to take care of pets while you are in the hospital. ? Purchase a rear-facing car seat and make sure you know how to install it in your car. ? Pack your hospital bag. ? Prepare the baby's nursery. Make sure to remove all pillows and stuffed animals from the baby's crib to prevent suffocation.  Visit your dentist if you have not gone during your pregnancy. Use a soft toothbrush to brush your teeth and be gentle when you floss. Contact a health care provider if:  You are unsure if you are in labor or if your water has broken.  You become dizzy.  You have mild pelvic cramps, pelvic pressure, or nagging pain in your abdominal area.  You have lower back pain.  You have persistent  nausea, vomiting, or diarrhea.  You have an unusual or bad smelling vaginal discharge.  You have pain when you urinate. Get help right away if:  Your water breaks before 37 weeks.  You have regular contractions less than 5 minutes apart before 37 weeks.  You have a fever.  You are leaking fluid from your vagina.  You have spotting or bleeding from your vagina.  You have severe abdominal pain or cramping.  You have rapid weight loss or weight   gain.  You have shortness of breath with chest pain.  You notice sudden or extreme swelling of your face, hands, ankles, feet, or legs.  Your baby makes fewer than 10 movements in 2 hours.  You have severe headaches that do not go away when you take medicine.  You have vision changes. Summary  The third trimester is from week 28 through week 40, months 7 through 9. The third trimester is a time when the unborn baby (fetus) is growing rapidly.  During the third trimester, your discomfort may increase as you and your baby continue to gain weight. You may have abdominal, leg, and back pain, sleeping problems, and an increased need to urinate.  During the third trimester your breasts will keep growing and they will continue to become tender. A yellow fluid (colostrum) may leak from your breasts. This is the first milk you are producing for your baby.  False labor is a condition in which you feel small, irregular tightenings of the muscles in the womb (contractions) that eventually go away. These are called Braxton Hicks contractions. Contractions may last for hours, days, or even weeks before true labor sets in.  Signs of labor can include: abdominal cramps; regular contractions that start at 10 minutes apart and become stronger and more frequent with time; watery or bloody mucus discharge that comes from the vagina; increased pelvic pressure and dull back pain; and leaking of amniotic fluid. This information is not intended to replace advice  given to you by your health care provider. Make sure you discuss any questions you have with your health care provider. Document Released: 12/03/2001 Document Revised: 05/16/2016 Document Reviewed: 02/09/2013 [employer] Interactive Patient Education  2017 [employer]   Breastfeeding Deciding to breastfeed is one of the best choices you can make for you and your baby. A change in hormones during pregnancy causes your breast tissue to grow and increases the number and size of your milk ducts. These hormones also allow proteins, sugars, and fats from your blood supply to make breast milk in your milk-producing glands. Hormones prevent breast milk from being released before your baby is born as well as prompt milk flow after birth. Once breastfeeding has begun, thoughts of your baby, as well as his or her sucking or crying, can stimulate the release of milk from your milk-producing glands. Benefits of breastfeeding For Your Baby  Your first milk (colostrum) helps your baby's digestive system function better.  There are antibodies in your milk that help your baby fight off infections.  Your baby has a lower incidence of asthma, allergies, and sudden infant death syndrome.  The nutrients in breast milk are better for your baby than infant formulas and are designed uniquely for your baby's needs.  Breast milk improves your baby's brain development.  Your baby is less likely to develop other conditions, such as childhood obesity, asthma, or type 2 diabetes mellitus.  For You  Breastfeeding helps to create a very special bond between you and your baby.  Breastfeeding is convenient. Breast milk is always available at the correct temperature and costs nothing.  Breastfeeding helps to burn calories and helps you lose the weight gained during pregnancy.  Breastfeeding makes your uterus contract to its prepregnancy size faster and slows bleeding (lochia) after you give birth.  Breastfeeding helps  to lower your risk of developing type 2 diabetes mellitus, osteoporosis, and breast or ovarian cancer later in life.  Signs that your baby is hungry Early Signs of Hunger    Increased alertness or activity.  Stretching.  Movement of the head from side to side.  Movement of the head and opening of the mouth when the corner of the mouth or cheek is stroked (rooting).  Increased sucking sounds, smacking lips, cooing, sighing, or squeaking.  Hand-to-mouth movements.  Increased sucking of fingers or hands.  Late Signs of Hunger  Fussing.  Intermittent crying.  Extreme Signs of Hunger Signs of extreme hunger will require calming and consoling before your baby will be able to breastfeed successfully. Do not wait for the following signs of extreme hunger to occur before you initiate breastfeeding:  Restlessness.  A loud, strong cry.  Screaming.  Breastfeeding basics Breastfeeding Initiation  Find a comfortable place to sit or lie down, with your neck and back well supported.  Place a pillow or rolled up blanket under your baby to bring him or her to the level of your breast (if you are seated). Nursing pillows are specially designed to help support your arms and your baby while you breastfeed.  Make sure that your baby's abdomen is facing your abdomen.  Gently massage your breast. With your fingertips, massage from your chest wall toward your nipple in a circular motion. This encourages milk flow. You may need to continue this action during the feeding if your milk flows slowly.  Support your breast with 4 fingers underneath and your thumb above your nipple. Make sure your fingers are well away from your nipple and your baby's mouth.  Stroke your baby's lips gently with your finger or nipple.  When your baby's mouth is open wide enough, quickly bring your baby to your breast, placing your entire nipple and as much of the colored area around your nipple (areola) as possible into  your baby's mouth. ? More areola should be visible above your baby's upper lip than below the lower lip. ? Your baby's tongue should be between his or her lower gum and your breast.  Ensure that your baby's mouth is correctly positioned around your nipple (latched). Your baby's lips should create a seal on your breast and be turned out (everted).  It is common for your baby to suck about 2-3 minutes in order to start the flow of breast milk.  Latching Teaching your baby how to latch on to your breast properly is very important. An improper latch can cause nipple pain and decreased milk supply for you and poor weight gain in your baby. Also, if your baby is not latched onto your nipple properly, he or she may swallow some air during feeding. This can make your baby fussy. Burping your baby when you switch breasts during the feeding can help to get rid of the air. However, teaching your baby to latch on properly is still the best way to prevent fussiness from swallowing air while breastfeeding. Signs that your baby has successfully latched on to your nipple:  Silent tugging or silent sucking, without causing you pain.  Swallowing heard between every 3-4 sucks.  Muscle movement above and in front of his or her ears while sucking.  Signs that your baby has not successfully latched on to nipple:  Sucking sounds or smacking sounds from your baby while breastfeeding.  Nipple pain.  If you think your baby has not latched on correctly, slip your finger into the corner of your baby's mouth to break the suction and place it between your baby's gums. Attempt breastfeeding initiation again. Signs of Successful Breastfeeding Signs from your baby:  A   gradual decrease in the number of sucks or complete cessation of sucking.  Falling asleep.  Relaxation of his or her body.  Retention of a small amount of milk in his or her mouth.  Letting go of your breast by himself or herself.  Signs from  you:  Breasts that have increased in firmness, weight, and size 1-3 hours after feeding.  Breasts that are softer immediately after breastfeeding.  Increased milk volume, as well as a change in milk consistency and color by the fifth day of breastfeeding.  Nipples that are not sore, cracked, or bleeding.  Signs That Your Baby is Getting Enough Milk  Wetting at least 1-2 diapers during the first 24 hours after birth.  Wetting at least 5-6 diapers every 24 hours for the first week after birth. The urine should be clear or pale yellow by 5 days after birth.  Wetting 6-8 diapers every 24 hours as your baby continues to grow and develop.  At least 3 stools in a 24-hour period by age 5 days. The stool should be soft and yellow.  At least 3 stools in a 24-hour period by age 7 days. The stool should be seedy and yellow.  No loss of weight greater than 10% of birth weight during the first 3 days of age.  Average weight gain of 4-7 ounces (113-198 g) per week after age 4 days.  Consistent daily weight gain by age 5 days, without weight loss after the age of 2 weeks.  After a feeding, your baby may spit up a small amount. This is common. Breastfeeding frequency and duration Frequent feeding will help you make more milk and can prevent sore nipples and breast engorgement. Breastfeed when you feel the need to reduce the fullness of your breasts or when your baby shows signs of hunger. This is called "breastfeeding on demand." Avoid introducing a pacifier to your baby while you are working to establish breastfeeding (the first 4-6 weeks after your baby is born). After this time you may choose to use a pacifier. Research has shown that pacifier use during the first year of a baby's life decreases the risk of sudden infant death syndrome (SIDS). Allow your baby to feed on each breast as long as he or she wants. Breastfeed until your baby is finished feeding. When your baby unlatches or falls asleep  while feeding from the first breast, offer the second breast. Because newborns are often sleepy in the first few weeks of life, you may need to awaken your baby to get him or her to feed. Breastfeeding times will vary from baby to baby. However, the following rules can serve as a guide to help you ensure that your baby is properly fed:  Newborns (babies 4 weeks of age or younger) may breastfeed every 1-3 hours.  Newborns should not go longer than 3 hours during the day or 5 hours during the night without breastfeeding.  You should breastfeed your baby a minimum of 8 times in a 24-hour period until you begin to introduce solid foods to your baby at around 6 months of age.  Breast milk pumping Pumping and storing breast milk allows you to ensure that your baby is exclusively fed your breast milk, even at times when you are unable to breastfeed. This is especially important if you are going back to work while you are still breastfeeding or when you are not able to be present during feedings. Your lactation consultant can give you guidelines on how   long it is safe to store breast milk. A breast pump is a machine that allows you to pump milk from your breast into a sterile bottle. The pumped breast milk can then be stored in a refrigerator or freezer. Some breast pumps are operated by hand, while others use electricity. Ask your lactation consultant which type will work best for you. Breast pumps can be purchased, but some hospitals and breastfeeding support groups lease breast pumps on a monthly basis. A lactation consultant can teach you how to hand express breast milk, if you prefer not to use a pump. Caring for your breasts while you breastfeed Nipples can become dry, cracked, and sore while breastfeeding. The following recommendations can help keep your breasts moisturized and healthy:  Avoid using soap on your nipples.  Wear a supportive bra. Although not required, special nursing bras and tank  tops are designed to allow access to your breasts for breastfeeding without taking off your entire bra or top. Avoid wearing underwire-style bras or extremely tight bras.  Air dry your nipples for 3-4minutes after each feeding.  Use only cotton bra pads to absorb leaked breast milk. Leaking of breast milk between feedings is normal.  Use lanolin on your nipples after breastfeeding. Lanolin helps to maintain your skin's normal moisture barrier. If you use pure lanolin, you do not need to wash it off before feeding your baby again. Pure lanolin is not toxic to your baby. You may also hand express a few drops of breast milk and gently massage that milk into your nipples and allow the milk to air dry.  In the first few weeks after giving birth, some women experience extremely full breasts (engorgement). Engorgement can make your breasts feel heavy, warm, and tender to the touch. Engorgement peaks within 3-5 days after you give birth. The following recommendations can help ease engorgement:  Completely empty your breasts while breastfeeding or pumping. You may want to start by applying warm, moist heat (in the shower or with warm water-soaked hand towels) just before feeding or pumping. This increases circulation and helps the milk flow. If your baby does not completely empty your breasts while breastfeeding, pump any extra milk after he or she is finished.  Wear a snug bra (nursing or regular) or tank top for 1-2 days to signal your body to slightly decrease milk production.  Apply ice packs to your breasts, unless this is too uncomfortable for you.  Make sure that your baby is latched on and positioned properly while breastfeeding.  If engorgement persists after 48 hours of following these recommendations, contact your health care provider or a lactation consultant. Overall health care recommendations while breastfeeding  Eat healthy foods. Alternate between meals and snacks, eating 3 of each per  day. Because what you eat affects your breast milk, some of the foods may make your baby more irritable than usual. Avoid eating these foods if you are sure that they are negatively affecting your baby.  Drink milk, fruit juice, and water to satisfy your thirst (about 10 glasses a day).  Rest often, relax, and continue to take your prenatal vitamins to prevent fatigue, stress, and anemia.  Continue breast self-awareness checks.  Avoid chewing and smoking tobacco. Chemicals from cigarettes that pass into breast milk and exposure to secondhand smoke may harm your baby.  Avoid alcohol and drug use, including marijuana. Some medicines that may be harmful to your baby can pass through breast milk. It is important to ask your health care   provider before taking any medicine, including all over-the-counter and prescription medicine as well as vitamin and herbal supplements. It is possible to become pregnant while breastfeeding. If birth control is desired, ask your health care provider about options that will be safe for your baby. Contact a health care provider if:  You feel like you want to stop breastfeeding or have become frustrated with breastfeeding.  You have painful breasts or nipples.  Your nipples are cracked or bleeding.  Your breasts are red, tender, or warm.  You have a swollen area on either breast.  You have a fever or chills.  You have nausea or vomiting.  You have drainage other than breast milk from your nipples.  Your breasts do not become full before feedings by the fifth day after you give birth.  You feel sad and depressed.  Your baby is too sleepy to eat well.  Your baby is having trouble sleeping.  Your baby is wetting less than 3 diapers in a 24-hour period.  Your baby has less than 3 stools in a 24-hour period.  Your baby's skin or the white part of his or her eyes becomes yellow.  Your baby is not gaining weight by 5 days of age. Get help right away  if:  Your baby is overly tired (lethargic) and does not want to wake up and feed.  Your baby develops an unexplained fever. This information is not intended to replace advice given to you by your health care provider. Make sure you discuss any questions you have with your health care provider. Document Released: 12/09/2005 Document Revised: 05/22/2016 Document Reviewed: 06/02/2013 Elsevier Interactive Patient Education  2017 Elsevier Inc.  

## 2017-03-10 NOTE — Progress Notes (Signed)
    PRENATAL VISIT NOTE  Subjective:  Courtney CongoBich Cyndie Chimeguyen is a 26 y.o. G2P0101 at [redacted]w[redacted]d being seen today for ongoing prenatal care.  She is currently monitored for the following issues for this high-risk pregnancy and has GDM (1st trimester dx); History of premature rupture of membranes in previous pregnancy, currently pregnant; Short interval between pregnancies affecting pregnancy in second trimester, antepartum; ASCUS with positive high risk HPV cervical; and Supervision of high risk pregnancy in second trimester on her problem list.  Patient reports no complaints.  Contractions: Irregular. Vag. Bleeding: None.  Movement: Present. Denies leaking of fluid.   The following portions of the patient's history were reviewed and updated as appropriate: allergies, current medications, past family history, past medical history, past social history, past surgical history and problem list. Problem list updated.  Objective:   Vitals:   03/10/17 0845  BP: 110/63  Pulse: 77  Weight: 101 lb 1.6 oz (45.9 kg)    Fetal Status: Fetal Heart Rate (bpm): NST   Movement: Present     General:  Alert, oriented and cooperative. Patient is in no acute distress.  Skin: Skin is warm and dry. No rash noted.   Cardiovascular: Normal heart rate noted  Respiratory: Normal respiratory effort, no problems with respiration noted  Abdomen: Soft, gravid, appropriate for gestational age. Pain/Pressure: Present     Pelvic:  Cervical exam deferred        Extremities: Normal range of motion.  Edema: None  Mental Status: Normal mood and affect. Normal behavior. Normal judgment and thought content.  NST reviewed and reactive.  FBS 64-77 2 hour pp 83-139 (3 of 21 out of range) mostly after breakfast  U/S growth today vtx, AFI 12.49, EFW 2600 gms 5 lb 12 oz (30%)  Assessment and Plan:  Pregnancy: G2P0101 at [redacted]w[redacted]d  1. Gestational diabetes mellitus (GDM) in third trimester controlled on oral hypoglycemic drug Continue  glyburide and glucophage BS are well controlled  2. Supervision of high risk pregnancy in third trimester Continue  prenatal care. GBS negative  3. History of preterm delivery, currently pregnant in third trimester Last 17 P today  Term labor symptoms and general obstetric precautions including but not limited to vaginal bleeding, contractions, leaking of fluid and fetal movement were reviewed in detail with the patient. Please refer to After Visit Summary for other counseling recommendations.  Return in about 3 days (around 03/13/2017) for as scheduled.   Reva Boresanya S Quanda Pavlicek, MD

## 2017-03-11 ENCOUNTER — Ambulatory Visit (HOSPITAL_COMMUNITY): Payer: Medicaid Other

## 2017-03-13 ENCOUNTER — Ambulatory Visit (INDEPENDENT_AMBULATORY_CARE_PROVIDER_SITE_OTHER): Payer: BLUE CROSS/BLUE SHIELD | Admitting: Obstetrics and Gynecology

## 2017-03-13 VITALS — BP 104/55 | HR 76

## 2017-03-13 DIAGNOSIS — O24415 Gestational diabetes mellitus in pregnancy, controlled by oral hypoglycemic drugs: Secondary | ICD-10-CM

## 2017-03-13 NOTE — Progress Notes (Signed)
IOL scheduled 4/4 @ 0700

## 2017-03-13 NOTE — Progress Notes (Signed)
NST Note Date: 03/13/2017 Gestational Age: 28/1 FHT: 145 baseline, positive accelerations, negative  deceleration, moderate variability Toco: +irritability Time: 25 minutes  A/P: rNST. Continue with current plan of care.   Cornelia Copaharlie Dara Camargo, Jr MD Attending [facility](Faculty Practice)

## 2017-03-14 ENCOUNTER — Encounter (HOSPITAL_COMMUNITY): Payer: Self-pay | Admitting: *Deleted

## 2017-03-14 ENCOUNTER — Inpatient Hospital Stay (HOSPITAL_COMMUNITY): Payer: BLUE CROSS/BLUE SHIELD | Admitting: Anesthesiology

## 2017-03-14 ENCOUNTER — Inpatient Hospital Stay (HOSPITAL_COMMUNITY)
Admission: AD | Admit: 2017-03-14 | Discharge: 2017-03-16 | DRG: 775 | Disposition: A | Discharge status: Home / Self Care | Payer: BLUE CROSS/BLUE SHIELD | Source: Ambulatory Visit | Attending: Obstetrics & Gynecology | Admitting: Obstetrics & Gynecology

## 2017-03-14 DIAGNOSIS — O24425 Gestational diabetes mellitus in childbirth, controlled by oral hypoglycemic drugs: Secondary | ICD-10-CM | POA: Diagnosis present

## 2017-03-14 DIAGNOSIS — Z3A37 37 weeks gestation of pregnancy: Secondary | ICD-10-CM

## 2017-03-14 DIAGNOSIS — O4202 Full-term premature rupture of membranes, onset of labor within 24 hours of rupture: Secondary | ICD-10-CM | POA: Diagnosis not present

## 2017-03-14 DIAGNOSIS — O09293 Supervision of pregnancy with other poor reproductive or obstetric history, third trimester: Secondary | ICD-10-CM

## 2017-03-14 LAB — TYPE AND SCREEN
ABO/RH(D): O POS
Antibody Screen: NEGATIVE

## 2017-03-14 LAB — CBC
HEMATOCRIT: 37 % (ref 36.0–46.0)
HEMOGLOBIN: 12.4 g/dL (ref 12.0–15.0)
MCH: 29.7 pg (ref 26.0–34.0)
MCHC: 33.5 g/dL (ref 30.0–36.0)
MCV: 88.7 fL (ref 78.0–100.0)
Platelets: 220 10*3/uL (ref 150–400)
RBC: 4.17 MIL/uL (ref 3.87–5.11)
RDW: 13.6 % (ref 11.5–15.5)
WBC: 6.3 10*3/uL (ref 4.0–10.5)

## 2017-03-14 LAB — GLUCOSE, CAPILLARY: Glucose-Capillary: 70 mg/dL (ref 65–99)

## 2017-03-14 LAB — POCT FERN TEST: POCT Fern Test: POSITIVE

## 2017-03-14 MED ORDER — IBUPROFEN 600 MG PO TABS
600.0000 mg | ORAL_TABLET | Freq: Four times a day (QID) | ORAL | Status: DC
  Administered 2017-03-14 – 2017-03-16 (×9): 600 mg via ORAL
  Filled 2017-03-14 (×9): qty 1

## 2017-03-14 MED ORDER — BENZOCAINE-MENTHOL 20-0.5 % EX AERO: 1.0000 "application " | INHALATION_SPRAY | CUTANEOUS | Status: DC | PRN

## 2017-03-14 MED ORDER — DIBUCAINE 1 % RE OINT: 1.0000 "application " | TOPICAL_OINTMENT | RECTAL | Status: DC | PRN

## 2017-03-14 MED ORDER — EPHEDRINE 5 MG/ML INJ
10.0000 mg | INTRAVENOUS | Status: DC | PRN
  Filled 2017-03-14: qty 2

## 2017-03-14 MED ORDER — LIDOCAINE HCL (PF) 1 % IJ SOLN
INTRAMUSCULAR | Status: DC | PRN
  Administered 2017-03-14: 13 mL via EPIDURAL

## 2017-03-14 MED ORDER — ACETAMINOPHEN 325 MG PO TABS: 650.0000 mg | ORAL_TABLET | ORAL | Status: DC | PRN

## 2017-03-14 MED ORDER — SOD CITRATE-CITRIC ACID 500-334 MG/5ML PO SOLN: 30.0000 mL | ORAL | Status: DC | PRN

## 2017-03-14 MED ORDER — OXYCODONE-ACETAMINOPHEN 5-325 MG PO TABS
2.0000 | ORAL_TABLET | ORAL | Status: DC | PRN
Start: 2017-03-14 — End: 2017-03-14

## 2017-03-14 MED ORDER — OXYTOCIN 40 UNITS IN LACTATED RINGERS INFUSION - SIMPLE MED
2.5000 [IU]/h | INTRAVENOUS | Status: DC
  Filled 2017-03-14: qty 1000

## 2017-03-14 MED ORDER — DIPHENHYDRAMINE HCL 25 MG PO CAPS: 25.0000 mg | ORAL_CAPSULE | Freq: Four times a day (QID) | ORAL | Status: DC | PRN

## 2017-03-14 MED ORDER — SIMETHICONE 80 MG PO CHEW: 80.0000 mg | CHEWABLE_TABLET | ORAL | Status: DC | PRN

## 2017-03-14 MED ORDER — PRENATAL MULTIVITAMIN CH
1.0000 | ORAL_TABLET | Freq: Every day | ORAL | Status: DC
  Administered 2017-03-14 – 2017-03-16 (×3): 1 via ORAL
  Filled 2017-03-14 (×3): qty 1

## 2017-03-14 MED ORDER — FLEET ENEMA 7-19 GM/118ML RE ENEM: 1.0000 | ENEMA | RECTAL | Status: DC | PRN

## 2017-03-14 MED ORDER — PHENYLEPHRINE 40 MCG/ML (10ML) SYRINGE FOR IV PUSH (FOR BLOOD PRESSURE SUPPORT)
80.0000 ug | PREFILLED_SYRINGE | INTRAVENOUS | Status: DC | PRN
  Filled 2017-03-14: qty 5

## 2017-03-14 MED ORDER — OXYTOCIN BOLUS FROM INFUSION
500.0000 mL | Freq: Once | INTRAVENOUS | Status: AC
  Administered 2017-03-14: 500 mL via INTRAVENOUS

## 2017-03-14 MED ORDER — ZOLPIDEM TARTRATE 5 MG PO TABS: 5.0000 mg | ORAL_TABLET | Freq: Every evening | ORAL | Status: DC | PRN

## 2017-03-14 MED ORDER — DIPHENHYDRAMINE HCL 50 MG/ML IJ SOLN: 12.5000 mg | INTRAMUSCULAR | Status: DC | PRN

## 2017-03-14 MED ORDER — OXYCODONE-ACETAMINOPHEN 5-325 MG PO TABS: 1.0000 | ORAL_TABLET | ORAL | Status: DC | PRN

## 2017-03-14 MED ORDER — LACTATED RINGERS IV SOLN: 500.0000 mL | INTRAVENOUS | Status: DC | PRN

## 2017-03-14 MED ORDER — FENTANYL CITRATE (PF) 100 MCG/2ML IJ SOLN: 50.0000 ug | INTRAMUSCULAR | Status: DC | PRN

## 2017-03-14 MED ORDER — FENTANYL 2.5 MCG/ML BUPIVACAINE 1/10 % EPIDURAL INFUSION (WH - ANES)
14.0000 mL/h | INTRAMUSCULAR | Status: DC | PRN
  Administered 2017-03-14: 14 mL/h via EPIDURAL
  Filled 2017-03-14: qty 100

## 2017-03-14 MED ORDER — LIDOCAINE HCL (PF) 1 % IJ SOLN
30.0000 mL | INTRAMUSCULAR | Status: DC | PRN
  Filled 2017-03-14: qty 30

## 2017-03-14 MED ORDER — SENNOSIDES-DOCUSATE SODIUM 8.6-50 MG PO TABS
2.0000 | ORAL_TABLET | ORAL | Status: DC
  Administered 2017-03-14 – 2017-03-15 (×2): 2 via ORAL
  Filled 2017-03-14 (×2): qty 2

## 2017-03-14 MED ORDER — TERBUTALINE SULFATE 1 MG/ML IJ SOLN
0.2500 mg | Freq: Once | INTRAMUSCULAR | Status: DC | PRN
  Filled 2017-03-14: qty 1

## 2017-03-14 MED ORDER — COCONUT OIL OIL: 1.0000 "application " | TOPICAL_OIL | Status: DC | PRN

## 2017-03-14 MED ORDER — WITCH HAZEL-GLYCERIN EX PADS: 1.0000 "application " | MEDICATED_PAD | CUTANEOUS | Status: DC | PRN

## 2017-03-14 MED ORDER — TETANUS-DIPHTH-ACELL PERTUSSIS 5-2.5-18.5 LF-MCG/0.5 IM SUSP: 0.5000 mL | Freq: Once | INTRAMUSCULAR | Status: DC

## 2017-03-14 MED ORDER — LACTATED RINGERS IV SOLN
INTRAVENOUS | Status: DC
  Administered 2017-03-14: 07:00:00 via INTRAVENOUS

## 2017-03-14 MED ORDER — LACTATED RINGERS IV SOLN
500.0000 mL | Freq: Once | INTRAVENOUS | Status: AC
  Administered 2017-03-14: 500 mL via INTRAVENOUS

## 2017-03-14 MED ORDER — ONDANSETRON HCL 4 MG/2ML IJ SOLN: 4.0000 mg | Freq: Four times a day (QID) | INTRAMUSCULAR | Status: DC | PRN

## 2017-03-14 MED ORDER — ONDANSETRON HCL 4 MG PO TABS
4.0000 mg | ORAL_TABLET | ORAL | Status: DC | PRN
Start: 2017-03-14 — End: 2017-03-16

## 2017-03-14 MED ORDER — PHENYLEPHRINE 40 MCG/ML (10ML) SYRINGE FOR IV PUSH (FOR BLOOD PRESSURE SUPPORT)
80.0000 ug | PREFILLED_SYRINGE | INTRAVENOUS | Status: DC | PRN
  Filled 2017-03-14: qty 10
  Filled 2017-03-14: qty 5

## 2017-03-14 MED ORDER — ONDANSETRON HCL 4 MG/2ML IJ SOLN: 4.0000 mg | INTRAMUSCULAR | Status: DC | PRN

## 2017-03-14 NOTE — H&P (Signed)
LABOR AND DELIVERY ADMISSION HISTORY AND PHYSICAL NOTE  Courtney Nguyen is a 26 y.o. female G107P0101 with IUP at [redacted]w[redacted]d by LMP and 16 wk [place] presenting for SOL and SROM. Patient endorses leakage of clear fluid around 0800 yesterday. Patient subsequently seen for prenatal appointment yesterday but did not disclose this information when seen. Onset of contractions started around 0500 this morning with minimal vaginal bleeding with continual loss of fluid prompting visit to MAU where patient was identified as having SOL with SROM. Prenatal history significant for A2GDM on glyburide and Glucophage, otherwise unremarkable. EFW 5'12 30%ile at 36 wks. Previous pregnancies include preterm at 105w1d with SVD, pelvis proven to 4'15. Admitted to L&D with anticipated SVD.  Prenatal History/Complications: A2GDM on glyburide and Glucophage diagnosed during first trimester History of preterm delivery  Past Medical History: Past Medical History:  Diagnosis Date  . Gestational diabetes   . History of gestational diabetes mellitus 09/25/2015   Normal 2 hr GTT on 12/02/15    . Intrauterine pregnancy   . Preterm labor     Past Surgical History: Past Surgical History:  Procedure Laterality Date  . NO PAST SURGERIES      Obstetrical History: OB History    Gravida Para Term Preterm AB Living   2 1   1   1    SAB TAB Ectopic Multiple Live Births         0 1      Social History: Social History   Social History  . Marital status: Married    Spouse name: N/A  . Number of children: 0  . Years of education: N/A   Social History Main Topics  . Smoking status: Never Smoker  . Smokeless tobacco: Never Used  . Alcohol use No  . Drug use: No  . Sexual activity: No   Other Topics Concern  . None   Social History Narrative  . None    Family History: Family History  Problem Relation Age of Onset  . Asthma Neg Hx   . Diabetes Neg Hx   . Hypertension Neg Hx   . Stroke Neg Hx     Allergies: No Known  Allergies  Facility-Administered Medications Prior to Admission  Medication Dose Route Frequency Provider Last Rate Last Dose  . hydroxyprogesterone caproate (MAKENA) 250 mg/mL injection 250 mg  250 mg Intramuscular Weekly Reva Bores, MD   250 mg at 03/10/17 0935   Prescriptions Prior to Admission  Medication Sig Dispense Refill Last Dose  . glyBURIDE (DIABETA) 2.5 MG tablet Take 0.5 tablets (1.25 mg total) by mouth 2 (two) times daily. 60 tablet 3 03/13/2017 at Unknown time  . metFORMIN (GLUCOPHAGE) 500 MG tablet Take 1 tablet (500 mg total) by mouth 2 (two) times daily with a meal. 60 tablet 3 03/13/2017 at Unknown time  . Prenatal Vit-Fe Fumarate-FA (PRENATAL MULTIVITAMIN) TABS tablet Take 1 tablet by mouth at bedtime.    03/13/2017 at Unknown time  . ACCU-CHEK FASTCLIX LANCETS MISC 1 Device by Percutaneous route 4 (four) times daily. 100 each 12 Taking  . aspirin EC 81 MG tablet Take 1 tablet (81 mg total) by mouth daily. (Patient not taking: Reported on 02/11/2017) 60 tablet 3 Not Taking  . glucose blood test strip Use as instructed 100 each 12 Taking     Review of Systems   All systems reviewed and negative except as stated in HPI  Blood pressure 118/71, pulse 76, temperature 97.6 F (36.4 C), temperature source Oral, resp.  rate 18, height 5' (1.524 m), weight 101 lb 8 oz (46 kg), last menstrual period 06/26/2016, unknown if currently breastfeeding. General appearance: alert, cooperative and mild distress Lungs: clear to auscultation bilaterally Heart: regular rate and rhythm Abdomen: soft, non-tender; bowel sounds normal Extremities: No calf swelling or tenderness Presentation: cephalic Fetal monitoring: Baseline 130 bpm, accelerations present, decelerations absent Uterine activity: q3-5 mins Dilation: 5 Effacement (%): 100 Station: 0 Exam by:: stone rnc   Prenatal labs: ABO, Rh: --/--/O POS (03/23 0723) Antibody: NEG (03/23 0723) Rubella: Immune RPR: NON REAC (01/22  0001)  HBsAg: Negative (10/26 0000)  HIV: NONREACTIVE (01/22 0001)  GBS: Negative (03/12 0904)  1 hr Glucola: Normal (82/225/169) Genetic screening: Too late Anatomy US: Normal  Prenatal Transfer Tool  Maternal Diabetes: Yes:  Diabetes Type:  Insulin/Medication controlled Genetic Screening: Too late Maternal Ultrasounds/Referrals: Normal Fetal Ultrasounds or other Referrals:  None Maternal Substance Abuse:  No Significant Maternal Medications:  None Significant Maternal Lab Results: Lab values include: Group B Strep negative  Results for orders placed or performed during the hospital encounter of 03/14/17 (from the past 24 hour(s))  CBC   Collection Time: 03/14/17  7:23 AM  Result Value Ref Range   WBC 6.3 4.0 - 10.5 K/uL   RBC 4.17 3.87 - 5.11 MIL/uL   Hemoglobin 12.4 12.0 - 15.0 g/dL   HCT 41.337.0 24.436.0 - 01.046.0 %   MCV 88.7 78.0 - 100.0 fL   MCH 29.7 26.0 - 34.0 pg   MCHC 33.5 30.0 - 36.0 g/dL   RDW 27.213.6 53.611.5 - 64.415.5 %   Platelets 220 150 - 400 K/uL  Type and screen [facility]   Collection Time: 03/14/17  7:23 AM  Result Value Ref Range   ABO/RH(D) O POS    Antibody Screen NEG    Sample Expiration 03/17/2017   Fern Test   Collection Time: 03/14/17  7:31 AM  Result Value Ref Range   POCT Fern Test Positive = ruptured amniotic membanes   Glucose, capillary   Collection Time: 03/14/17  8:06 AM  Result Value Ref Range   Glucose-Capillary 70 65 - 99 mg/dL    Patient Active Problem List   Diagnosis Date Noted  . Normal labor 03/14/2017  . History of premature rupture of membranes in previous pregnancy, currently pregnant 11/04/2016  . Short interval between pregnancies affecting pregnancy in second trimester, antepartum 11/04/2016  . ASCUS with positive high risk HPV cervical 11/04/2016  . Supervision of high risk pregnancy in second trimester 11/04/2016  . GDM (1st trimester dx) 09/25/2015    Assessment: Courtney Nguyen is a 26 y.o. G2P0101 at 2107w2d  here for SOL and SROM.  #Labor:SOL, progressing well #Pain: Epidural #FWB: Category I #ID:  GBS negative #MOF: Breast and bottle #MOC:Condoms #Circ:  Undecided  Wendee Beaversavid J McMullen, DO, PGY-1 03/14/2017, 9:17 AM  Midwife attestation: I have seen and examined this patient; I agree with above documentation in the resident's note.   Courtney Nguyen is a 26 y.o. G2P0101 here for labor and SROM  PE: VSS, afebrile Gen: calm comfortable, NAD Resp: normal effort, no distress Abd: gravid  ROS, labs, PMH reviewed  Assessment/Plan: [redacted] weeks gestation Prolonged SROM Active labor Admit to LD Anticipate SVD  Donette LarryMelanie Colby Reels, CNM  03/14/2017, 10:33 AM

## 2017-03-14 NOTE — Anesthesia Preprocedure Evaluation (Signed)
Anesthesia Evaluation  Patient identified by MRN, date of birth, ID band Patient awake    Reviewed: Allergy & Precautions, NPO status , Patient's Chart, lab work & pertinent test results  History of Anesthesia Complications Negative for: history of anesthetic complications  Airway Mallampati: II  TM Distance: >3 FB Neck ROM: Full    Dental no notable dental hx. (+) Dental Advisory Given   Pulmonary neg pulmonary ROS,    Pulmonary exam normal breath sounds clear to auscultation       Cardiovascular negative cardio ROS Normal cardiovascular exam Rhythm:Regular Rate:Normal     Neuro/Psych negative neurological ROS  negative psych ROS   GI/Hepatic negative GI ROS, Neg liver ROS,   Endo/Other  diabetes, Gestational  Renal/GU negative Renal ROS  negative genitourinary   Musculoskeletal negative musculoskeletal ROS (+)   Abdominal   Peds negative pediatric ROS (+)  Hematology negative hematology ROS (+)   Anesthesia Other Findings   Reproductive/Obstetrics (+) Pregnancy                             Anesthesia Physical Anesthesia Plan  ASA: II  Anesthesia Plan: Epidural   Post-op Pain Management:    Induction:   Airway Management Planned:   Additional Equipment:   Intra-op Plan:   Post-operative Plan:   Informed Consent: I have reviewed the patients History and Physical, chart, labs and discussed the procedure including the risks, benefits and alternatives for the proposed anesthesia with the patient or authorized representative who has indicated his/her understanding and acceptance.     Plan Discussed with:   Anesthesia Plan Comments:         Anesthesia Quick Evaluation  

## 2017-03-14 NOTE — MAU Provider Note (Signed)
Peter CongoBich Cyndie Chimeguyen is a 26 y.o. G2P0101 at 4945w2d who presents today with leaking of fluid.. She confirms fetal movement.  VSS, afebrile Abdomen: soft, non-tender, gravid Bedside US: VERTEX  FHT: 130, moderate with 15x15 accels, no decels Toco: 3-5 mins  No results found for this or any previous visit (from the past 24 hour(s)). Bedside us confirms vertex Will admit to labor and delivery  Tawnya CrookHogan, Heather Donovan  7:15 AM 03/14/17

## 2017-03-14 NOTE — Anesthesia Postprocedure Evaluation (Signed)
Anesthesia Post Note  Patient: Courtney Nguyen  Procedure(s) Performed: * No procedures listed *  Patient location during evaluation: Mother Baby Anesthesia Type: Epidural Level of consciousness: awake, awake and alert and oriented Pain management: pain level controlled Vital Signs Assessment: post-procedure vital signs reviewed and stable Respiratory status: spontaneous breathing, nonlabored ventilation and respiratory function stable Cardiovascular status: stable Postop Assessment: no headache, no backache, no signs of nausea or vomiting, adequate PO intake and patient able to bend at knees Anesthetic complications: no        Last Vitals:  Vitals:   03/14/17 1330 03/14/17 1750  BP: (!) 102/51 (!) 88/38  Pulse: 81 64  Resp: 18 18  Temp: 36.3 C 36.7 C    Last Pain:  Vitals:   03/14/17 1856  TempSrc:   PainSc: 2    Pain Goal: Patients Stated Pain Goal: 2 (03/14/17 1856)               Prapti Grussing

## 2017-03-14 NOTE — Lactation Note (Signed)
This note was copied from a baby's chart. Lactation Consultation Note  Patient Name: Boy Payton MccallumBich Thaw RUEAV'WToday's Date: 03/14/2017 Reason for consult: Initial assessment   [facility] consult [facility] < [facility]. Infant STS and starting to cue to feed. Mom with history of GDM on Glyburide/metformin. Mom reports her 1.5 yo was born preterm and did not latch, mom did not pump.   Assisted mom in latching infant to left breast in the laid back cross cradle hold. Infant latched and fed off and on for 10 minutes. Mom did well with assisting infant to latch.   Mom with small firm breasts with everted nipples. Mom report no breast changes with pregnancy. Showed mom how to hand express and small gtt colostrum noted to right breast. Mom reports she plans to give breast milk and formula.Enc her to offer breast prior to offering formula. Mom voiced understanding.   Enc mom to feed infant STS 8-12 x in 24 hours at first feeding cues. Enc mom to massage/compress breast with feeding. Infant weight pending but appears to be small. Feeding log given with instructions for use.   BF Resources Handout and LC Brochure given, mom informed of IP/OP Services, BF Support Groups and LC phone #. Enc mom to call out for feeding assistance as needed. Mom does not have a pump at home, Enc her to call her insurance company to inquire about a pump.    Maternal Data Formula Feeding for Exclusion: Yes Reason for exclusion: Mother's choice to formula and breast feed on admission Has patient been taught Hand Expression?: Yes Does the patient have breastfeeding experience prior to this delivery?: No (says 1.28 yo would not latch, was born preterm. She did not pump)  Feeding Feeding Type: Breast Fed Length of feed: 10 min  LATCH Score/Interventions Latch: Repeated attempts needed to sustain latch, nipple held in mouth throughout feeding, stimulation needed to elicit sucking reflex. Intervention(s):  Adjust position;Assist with latch;Breast massage;Breast compression  Audible Swallowing: A few with stimulation Intervention(s): Alternate breast massage;Hand expression;Skin to skin  Type of Nipple: Everted at rest and after stimulation  Comfort (Breast/Nipple): Soft / non-tender     Hold (Positioning): Assistance needed to correctly position infant at breast and maintain latch. Intervention(s): Breastfeeding basics reviewed;Support Pillows;Position options;Skin to skin  LATCH Score: 7  Lactation Tools Discussed/Used WIC Program: No   Consult Status Consult Status: Follow-up Date: 03/15/17 Follow-up type: In-patient    Silas FloodSharon S Aldean Suddeth 03/14/2017, 11:35 AM

## 2017-03-14 NOTE — MAU Note (Signed)
PT SAYS  FEELS UC-    STRONG  AT 0530.    PNC-  IN CLINIC.    VE  LAST WEEK - UNSURE.     DENIES HSV  OR  MRSA.   GBS- UNSURE

## 2017-03-14 NOTE — Anesthesia Procedure Notes (Signed)
Epidural Patient location during procedure: OB Start time: 03/14/2017 9:34 AM End time: 03/14/2017 9:55 AM  Staffing Anesthesiologist: Anitra LauthMILLER, Travis Purk RAY Performed: anesthesiologist   Preanesthetic Checklist Completed: patient identified, site marked, surgical consent, pre-op evaluation, timeout performed, IV checked, risks and benefits discussed and monitors and equipment checked  Epidural Patient position: sitting Prep: DuraPrep Patient monitoring: heart rate, cardiac monitor, continuous pulse ox and blood pressure Approach: midline Location: L2-L3 Injection technique: LOR saline  Needle:  Needle type: Tuohy  Needle gauge: 17 G Needle length: 9 cm Needle insertion depth: 3.5 cm Catheter type: closed end flexible Catheter size: 20 Guage Catheter at skin depth: 7 cm Test dose: negative  Assessment Events: blood not aspirated, injection not painful, no injection resistance, negative IV test and no paresthesia  Additional Notes Reason for block:procedure for pain

## 2017-03-15 LAB — RPR: RPR: NONREACTIVE

## 2017-03-15 LAB — GLUCOSE, CAPILLARY
GLUCOSE-CAPILLARY: 107 mg/dL — AB (ref 65–99)
GLUCOSE-CAPILLARY: 120 mg/dL — AB (ref 65–99)
Glucose-Capillary: 72 mg/dL (ref 65–99)
Glucose-Capillary: 60 mg/dL — ABNORMAL LOW (ref 65–99)

## 2017-03-15 NOTE — Progress Notes (Signed)
POSTPARTUM PROGRESS NOTE  Post Partum Day 1 Subjective:  Courtney Nguyen is a 26 y.o. [id number] 965w2d s/p SVD.  No acute events overnight.  Pt denies problems with ambulating, voiding or po intake.  She denies nausea or vomiting.  Pain is well controlled.  She has had flatus.  Lochia Minimal.   Objective: Blood pressure (!) 85/50, pulse 69, temperature 98.4 F (36.9 C), temperature source Oral, resp. rate 16, height 5' (1.524 m), weight 101 lb 8 oz (46 kg), last menstrual period 06/26/2016, SpO2 100 %, currently breastfeeding.  Physical Exam:  General: alert, cooperative and no distress Lochia:normal flow Chest: no respiratory distress Heart:regular rate, distal pulses intact Abdomen: soft, nontender,  Uterine Fundus: firm, appropriately tender DVT Evaluation: No calf swelling or tenderness Extremities: no edema   Recent Labs  03/14/17 0723  HGB 12.4  HCT 37.0    Assessment/Plan:  ASSESSMENT: Courtney Nguyen is a 26 y.o. [id number] 6965w2d s/p SVD  Postpartum day 1: cont routine care Breastfeeding: baby not latching well, consult lactation Contraception:  Natural family planning + condoms  Plan for discharge home tomorrow   LOS: 1 day   Amil AmenJulia RhodenMD 03/15/2017, 7:41 AM   I confirm that I have verified the information documented in the resident's note and that I have also personally reperformed the physical exam and all medical decision making activities. Fasting CBG: 72.  Cam HaiSHAW, KIMBERLY CNM 03/15/2017  9:40 AM

## 2017-03-15 NOTE — Progress Notes (Signed)
Pt not symptomatic on hypogly. Lunch arrived and husband brought snacks. Apple juice and crackers were given. Went from 60 to 107. Will continue to monitor.

## 2017-03-15 NOTE — Lactation Note (Signed)
This note was copied from a baby's chart. Lactation Consultation Note  Patient Name: Courtney Nguyen [id number]'QToday's Date: 03/15/2017 Reason for consult: Follow-up assessment  Baby 32 hours old. Mom reports that she is getting drops of breast milk when she hand expresses. Mom states that her milk did not come in with her first child--3344-month-old. Enc mom to keep putting baby to breast with cues and at least every 3 hours, then supplement with EBM/formula according to guidelines, and then post-pump for 15 minutes followed by hand expression. Mom reports that she has not been pumping, but has been hand expressing. Discussed the benefits of hand expressing and using DEBP. Enc mom to check with insurance company about getting a DEBP, and mom aware of hospital rental as needed.   Maternal Data    Feeding Feeding Type: Formula Nipple Type: Slow - flow Length of feed: 15 min  LATCH Score/Interventions Latch: Grasps breast easily, tongue down, lips flanged, rhythmical sucking.  Audible Swallowing: A few with stimulation  Type of Nipple: Everted at rest and after stimulation  Comfort (Breast/Nipple): Soft / non-tender     Hold (Positioning): No assistance needed to correctly position infant at breast.  LATCH Score: 9  Lactation Tools Discussed/Used     Consult Status Consult Status: Follow-up Date: 03/16/17 Follow-up type: In-patient    Sherlyn HayJennifer D Kween Bacorn 03/15/2017, 7:42 PM

## 2017-03-16 MED ORDER — IBUPROFEN 600 MG PO TABS: 600.0000 mg | ORAL_TABLET | Freq: Four times a day (QID) | ORAL | 0 refills | Status: AC

## 2017-03-16 NOTE — Discharge Summary (Signed)
OB Discharge Summary     Patient Name: Courtney Nguyen DOB: [birth year] MRN: [id number]  Date of admission: 03/14/2017 Delivering MD: Wendee BeaversMCMULLEN, DAVID J   Date of discharge: 03/16/2017  Admitting diagnosis: 37 WEEKS CTX BLEEDING Intrauterine pregnancy: 5041w2d     Secondary diagnosis:  Active Problems:   Normal labor  Additional problems: A2GDM     Discharge diagnosis: Term Pregnancy Delivered and GDM A2                                                                                                Post partum procedures:none  Augmentation: none  Complications: None  Hospital course:  Onset of Labor With Vaginal Delivery     26 y.o. yo [id number] at 10841w2d was admitted in Active Labor on 03/14/2017. Patient had an uncomplicated labor course as follows:  Membrane Rupture Time/Date: 8:00 AM ,03/14/2017   Intrapartum Procedures: Episiotomy: None [1]                                         Lacerations:  None [1]  Patient had a delivery of a Viable infant. 03/14/2017  Information for the patient's newborn:  Courtney Nguyen, Boy [name] [[id number]][[id number]]  Delivery Method: Vag-Spont    Pateint had an uncomplicated postpartum course.  She is ambulating, tolerating a regular diet, passing flatus, and urinating well. Patient is discharged home in stable condition on 03/16/17.   Physical exam  Vitals:   03/15/17 0603 03/15/17 1002 03/15/17 1729 03/16/17 0520  BP: (!) 85/50 (!) 94/55 (!) 101/50 (!) 97/41  Pulse: 69 (!) 58 78 61  Resp: 16 18 18 18   Temp: 98.4 F (36.9 C) 98.3 F (36.8 C) 97.8 F (36.6 C) 98.5 F (36.9 C)  TempSrc: Oral Oral Oral Oral  SpO2:  98%    Weight:      Height:       General: alert, cooperative and no distress Lochia: appropriate Uterine Fundus: firm Incision: Healing well with no significant drainage, No significant erythema DVT Evaluation: No evidence of DVT seen on physical exam. No significant calf/ankle edema. Labs: Lab Results  Component Value Date   WBC 6.3  03/14/2017   HGB 12.4 03/14/2017   HCT 37.0 03/14/2017   MCV 88.7 03/14/2017   PLT 220 03/14/2017   CMP Latest Ref Rng & Units 11/04/2016  Glucose 65 - 99 mg/dL 71  BUN 7 - 25 mg/dL 9  Creatinine 6.960.50 - 2.951.10 mg/dL 2.84(X0.38(L)  Sodium 324135 - 401146 mmol/L 136  Potassium 3.5 - 5.3 mmol/L 4.4  Chloride 98 - 110 mmol/L 103  CO2 20 - 31 mmol/L 26  Calcium 8.6 - 10.2 mg/dL 9.1  Total Protein 6.1 - 8.1 g/dL 7.0  Total Bilirubin 0.2 - 1.2 mg/dL 0.3  Alkaline Phos 33 - 115 U/L 39  AST 10 - 30 U/L 15  ALT 6 - 29 U/L 10    Discharge instruction: per After Visit Summary and "Baby and Me Booklet".  After visit meds: Ibuprofen 600 mg  po q6 prn, PNV 1 po daily   Diet: routine diet  Activity: Advance as tolerated. Pelvic rest for 6 weeks.   Outpatient follow up:6 weeks Follow up Appt:No future appointments. Follow up Visit:No Follow-up on file.  Postpartum contraception: Natural Family Planning  Newborn Data: Live born female  Birth Weight: 5 lb 10.3 oz (2560 g) APGAR: 9, 9  Baby Feeding: Bottle and Breast Disposition:home with mother   03/16/2017 Donette Larry, CNM

## 2017-03-16 NOTE — Lactation Note (Signed)
This note was copied from a baby's chart. Lactation Consultation Note  Patient Name: Courtney Nguyen OZDGU'YToday's Date: 03/16/2017 Reason for consult: Follow-up assessment   With this mom of an early term baby, now 1646 hours old, and weight 5 lbs 6.8 oz. Mom is formula feeding, but would like to also [provide EBM /BF for her baby. I explained 2 week pump rental to mom, and she is going to talk to her husband, and call me back fi she decides to do a 2 week rental . Mom encouraged to call her insurance company, and request a DEP.    Maternal Data    Feeding    LATCH Score/Interventions                      Lactation Tools Discussed/Used     Consult Status Consult Status: Follow-up Date: 03/16/17 Follow-up type: In-patient    Alfred LevinsLee, Jisell Majer Anne 03/16/2017, 8:52 AM

## 2017-03-17 ENCOUNTER — Other Ambulatory Visit: Payer: Self-pay | Admitting: Obstetrics & Gynecology

## 2017-03-17 ENCOUNTER — Ambulatory Visit: Payer: Self-pay

## 2017-03-17 NOTE — Lactation Note (Signed)
This note was copied from a baby's chart. Lactation Consultation Note  Patient Name: Courtney Nguyen ZOXWR'UToday's Date: 03/17/2017 Reason for consult: Follow-up assessment;Infant < 6lbs;Other (Comment) (early term baby) Mom reports she is BF with feedings before giving bottles. Mostly bottles charted. She has not been pumping. Mom able to latch baby independently. Mom reports she did not BF her 1st child. Advised Mom baby needs to be at breast 8-12 times or more in 24 hours. Keep baby nursing for 15-20 minutes, both breasts if possible. Continue to supplement till milk comes to volume. Engorgement care reviewed if needed. Advised of OP services and support group. Encouraged to call for questions/concerns. Mom reports understanding, denies need for interpreter.   Maternal Data    Feeding Feeding Type: Breast Fed  LATCH Score/Interventions Latch: Grasps breast easily, tongue down, lips flanged, rhythmical sucking. Intervention(s): Adjust position;Assist with latch;Breast massage;Breast compression  Audible Swallowing: A few with stimulation  Type of Nipple: Everted at rest and after stimulation  Comfort (Breast/Nipple): Soft / non-tender     Hold (Positioning): Assistance needed to correctly position infant at breast and maintain latch. Intervention(s): Breastfeeding basics reviewed;Support Pillows;Position options;Skin to skin  LATCH Score: 8  Lactation Tools Discussed/Used Tools: Pump Breast pump type: Manual   Consult Status Consult Status: Complete Date: 03/17/17 Follow-up type: In-patient    Alfred LevinsGranger, Lasheka Kempner Ann 03/17/2017, 10:24 AM

## 2017-03-20 ENCOUNTER — Other Ambulatory Visit: Payer: Self-pay | Admitting: Obstetrics & Gynecology

## 2017-03-24 ENCOUNTER — Other Ambulatory Visit: Payer: Self-pay | Admitting: Obstetrics & Gynecology

## 2017-03-26 ENCOUNTER — Inpatient Hospital Stay (HOSPITAL_COMMUNITY): Payer: BLUE CROSS/BLUE SHIELD

## 2017-03-27 ENCOUNTER — Other Ambulatory Visit: Payer: Self-pay | Admitting: Obstetrics and Gynecology

## 2017-03-31 ENCOUNTER — Other Ambulatory Visit: Payer: Self-pay | Admitting: Obstetrics & Gynecology

## 2017-04-10 ENCOUNTER — Ambulatory Visit: Payer: Self-pay | Admitting: Student

## 2017-05-05 ENCOUNTER — Ambulatory Visit: Payer: Self-pay | Admitting: Family Medicine

## 2018-09-27 IMAGING — US US MFM OB TRANSVAGINAL
1 series · 15 of 20 positions shown · non-contrast
Comparison: none

[Series 1: us mfm ob transvaginal · 20 acquisitions, 15 frames shown]
[im 1/20]
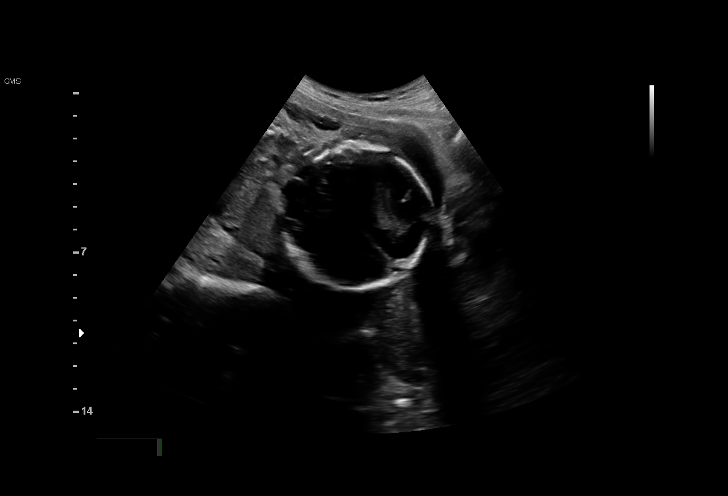
[im 3/20]
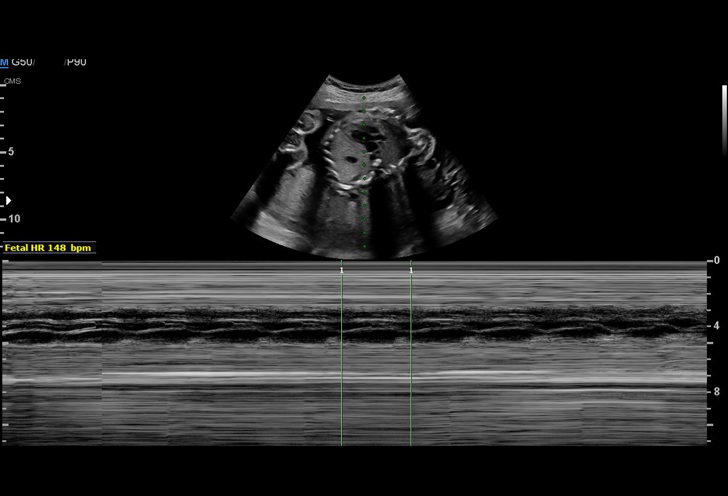
[im 4/20]
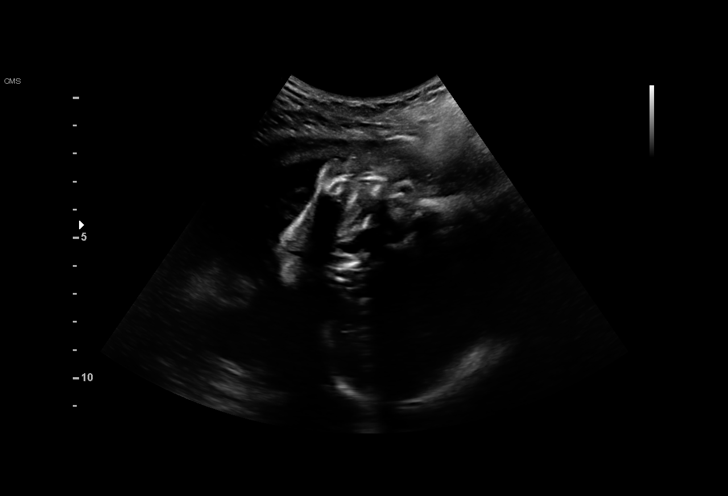
[im 5/20]
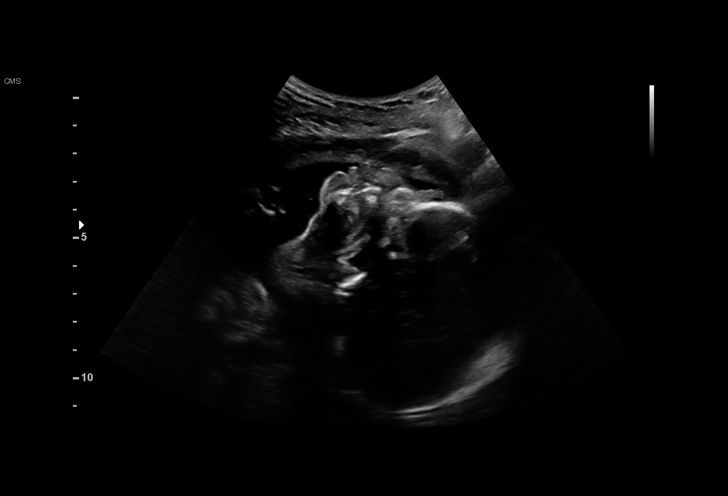
[im 7/20]
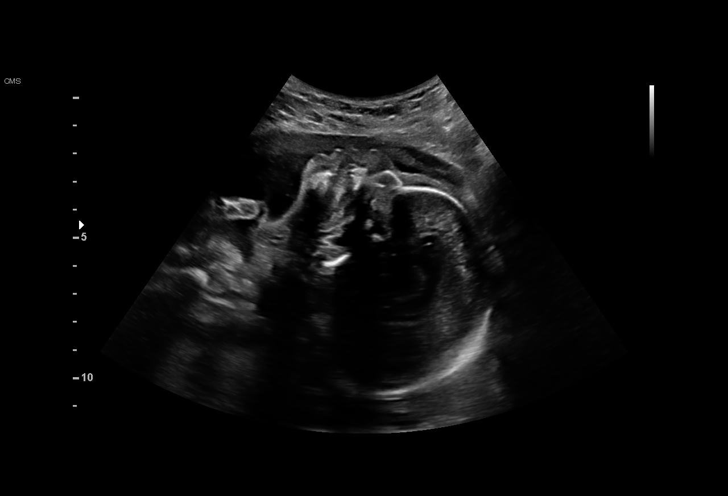
[im 8/20]
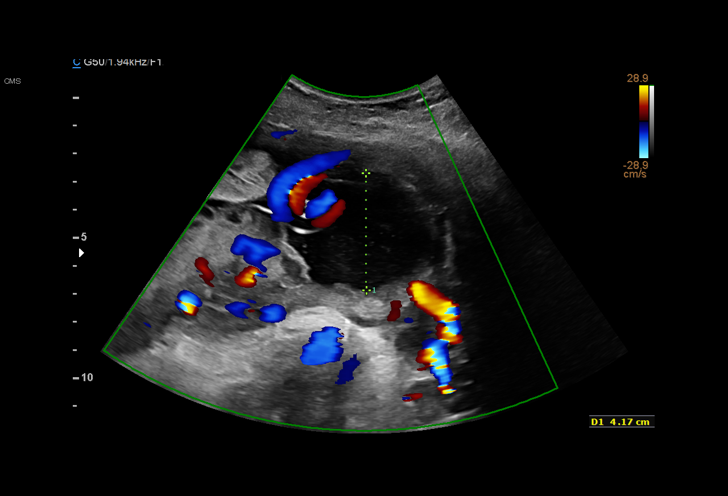
[im 9/20]
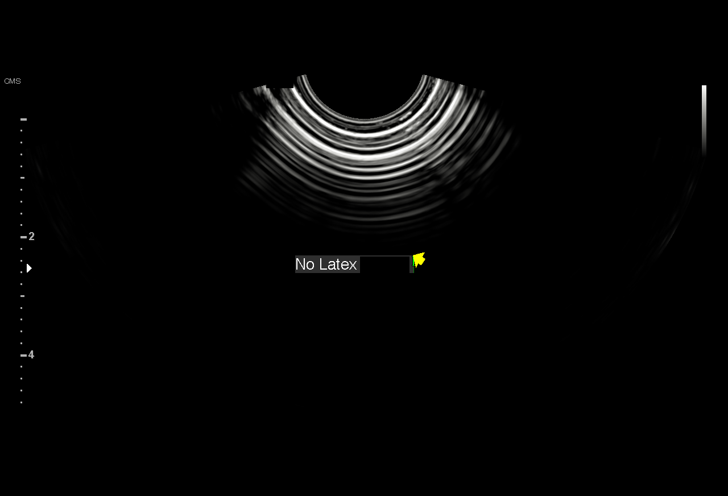
[im 11/20]
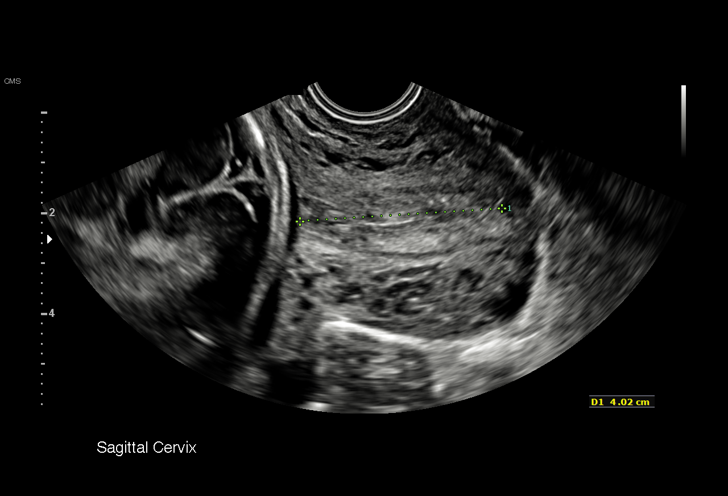
[im 12/20]
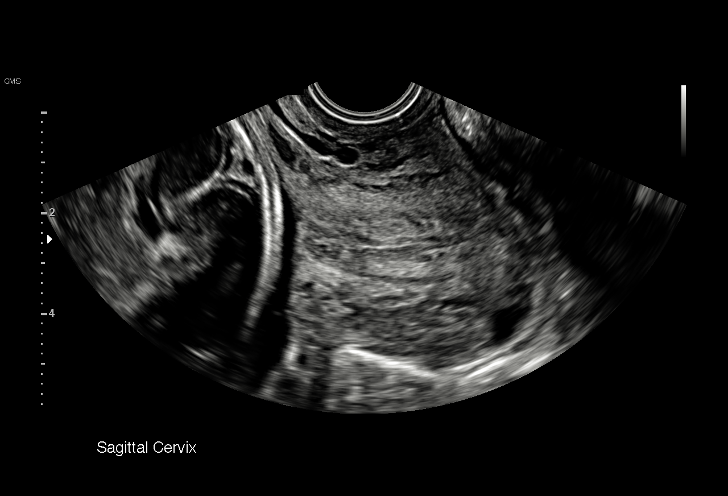
[im 13/20]
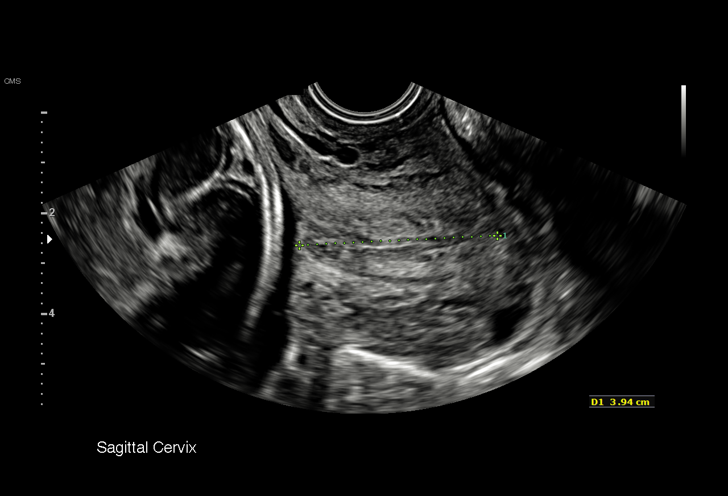
[im 15/20]
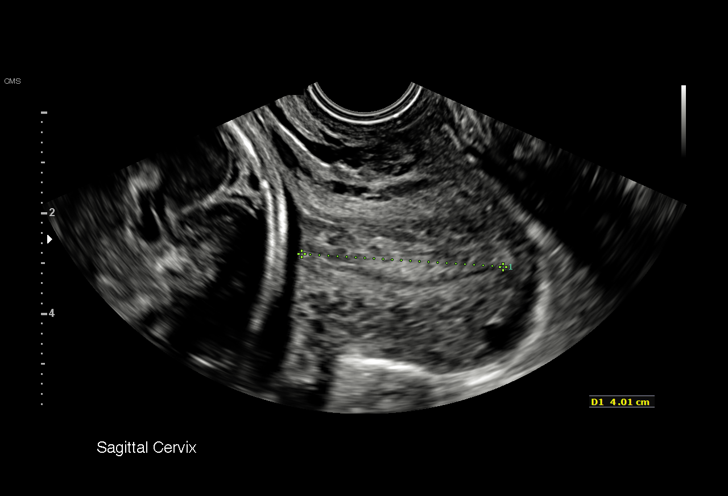
[im 16/20]
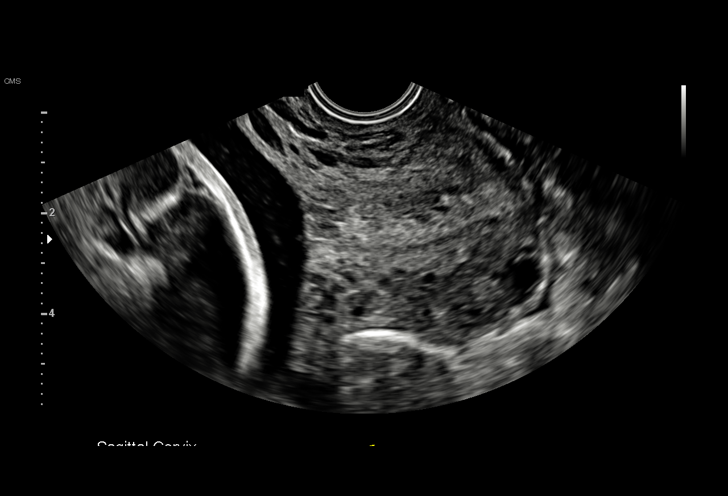
[im 17/20]
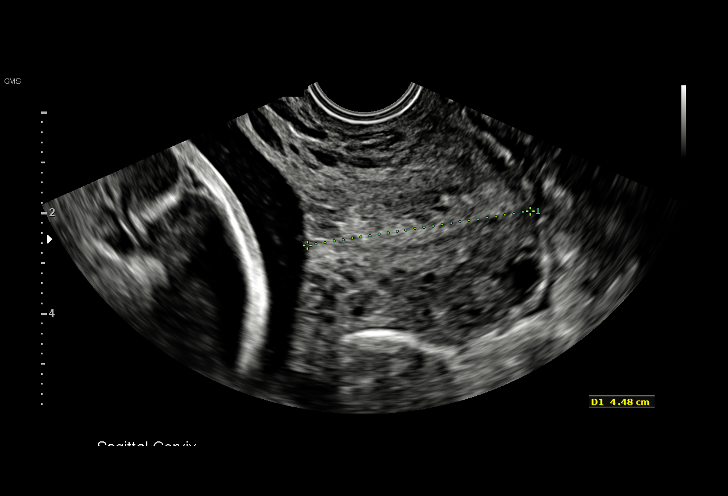
[im 19/20]
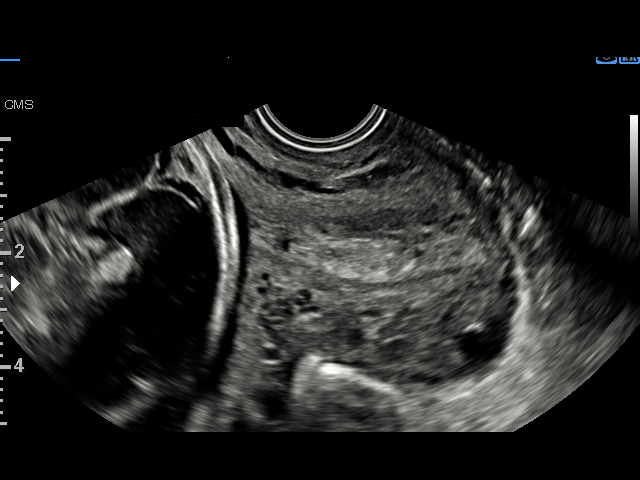
[im 20/20]
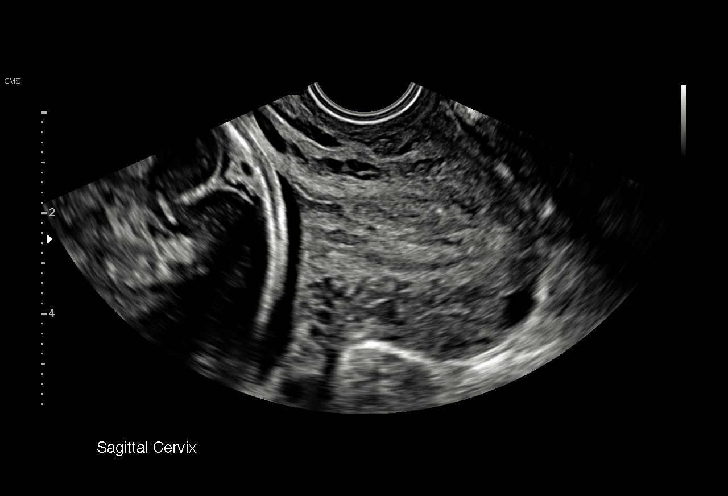

[15 of 20 positions shown; findings below may reference images not displayed]

OB/Gyn Clinic
[REDACTED]

1  PIRK FESER            [id number]      [id number]     [id number]
Indications

24 weeks gestation of pregnancy
Poor obstetric history: Previous preterm
delivery, antepartum (34 weeks)
Short interval between pregnancies
complicating pregnancy, antepartum
Encounter for cervical length
OB History

Blood Type:            Height:  5'1"   Weight (lb):  98        BMI:
Gravidity:    2         Term:   0        Prem:   1        SAB:   0
TOP:          0       Ectopic:  0        Living: 1
Fetal Evaluation

Num Of Fetuses:     1
Fetal Heart         148
Rate(bpm):
Cardiac Activity:   Observed
Presentation:       Cephalic
Amniotic Fluid
AFI FV:      Subjectively within normal limits

Largest Pocket(cm)
4.17
Gestational Age

LMP:           24w 6d        Date:  06/26/16                 EDD:   04/02/17
Best:          24w 6d     Det. By:  LMP  (06/26/16)          EDD:   04/02/17
Cervix Uterus Adnexa

Cervix
Length:              4  cm.
Normal appearance by transvaginal scan

Uterus
No abnormality visualized.
Impression

SIUP at 02w6d
active singleton fetus
cervix is long and closed
posterior placenta
no previa
Recommendations

Follow up ultrasound for growth/cervical length in 2 weeks as
previously scheduled (note: likely the last ultrasound needed
unless other indication arises).

## 2018-10-25 IMAGING — US US MFM OB FOLLOW-UP
1 series · 14 of 28 positions shown · non-contrast
Comparison: none

[Series 1: us mfm ob follow-up · 46 acquisitions, 14 frames shown]
[im 2/46]
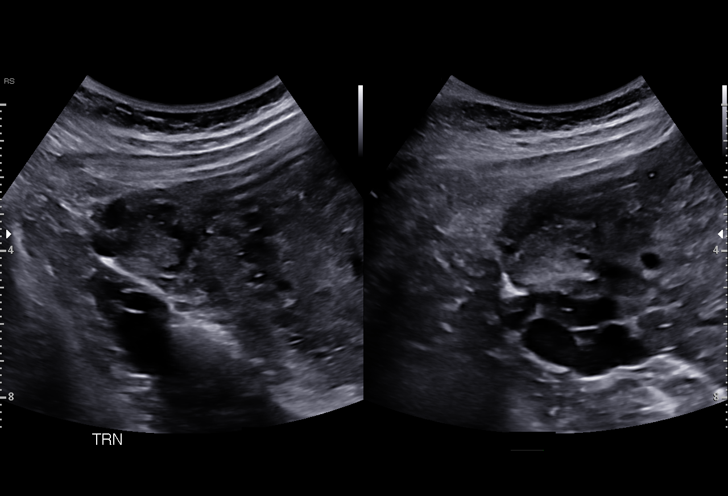
[im 6/46]
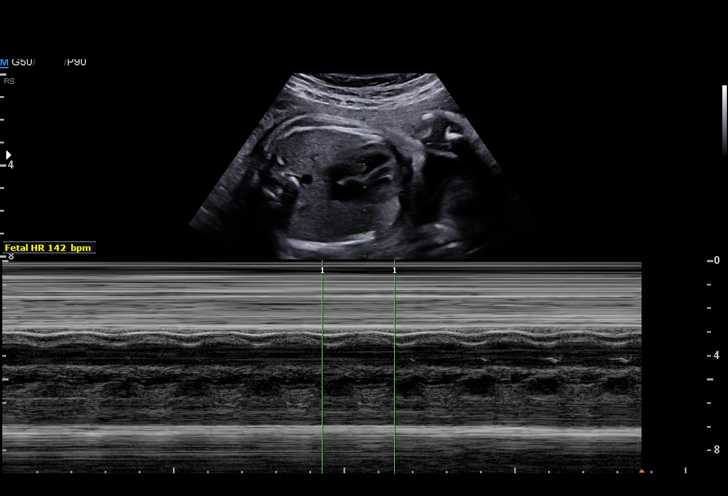
[im 9/46]
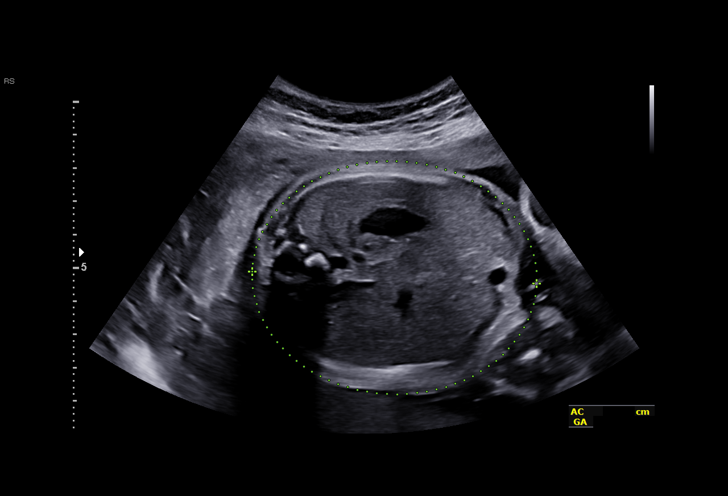
[im 12/46]
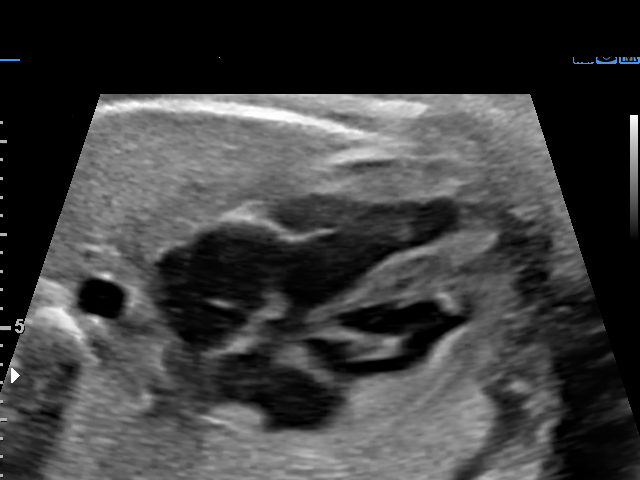
[im 16/46]
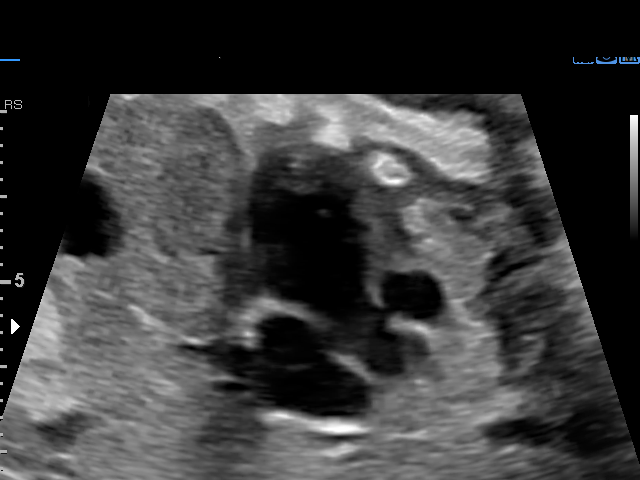
[im 19/46]
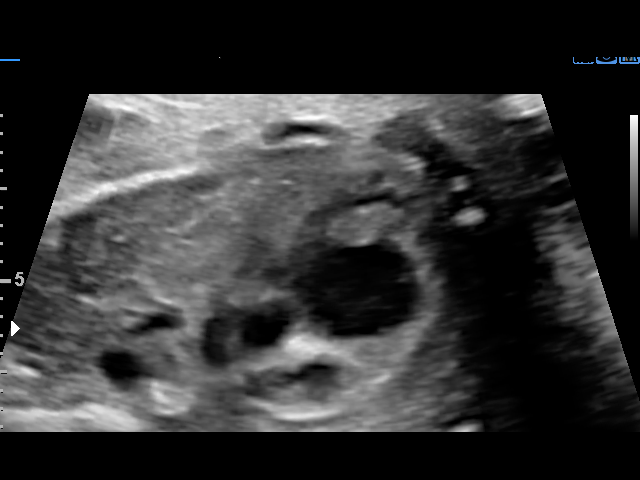
[im 22/46]
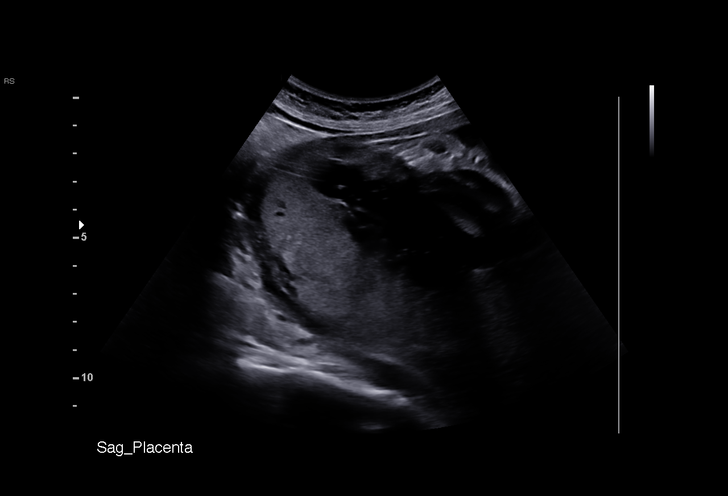
[im 26/46]
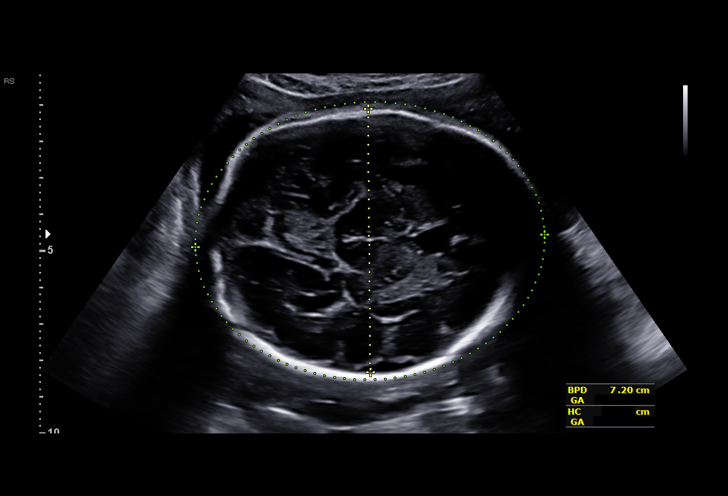
[im 29/46]
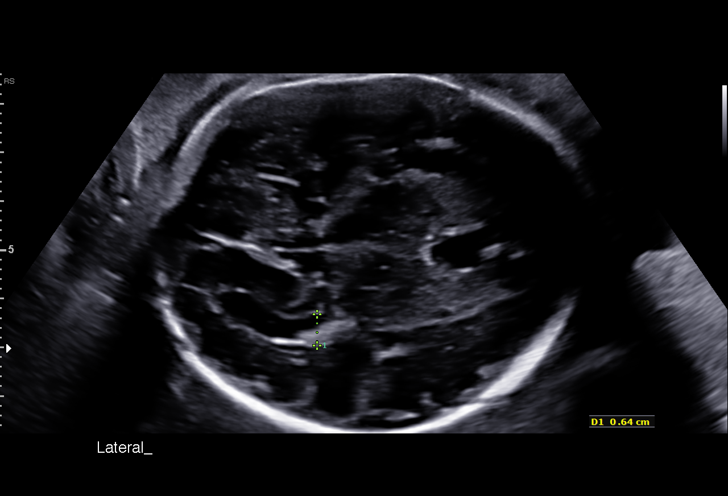
[im 32/46]
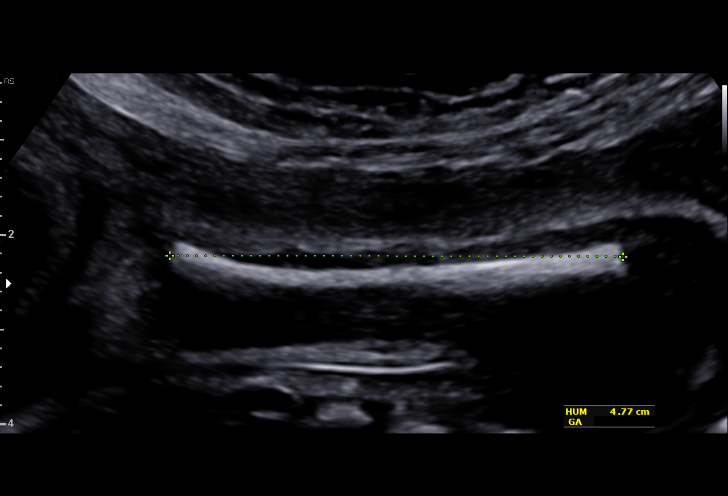
[im 36/46]
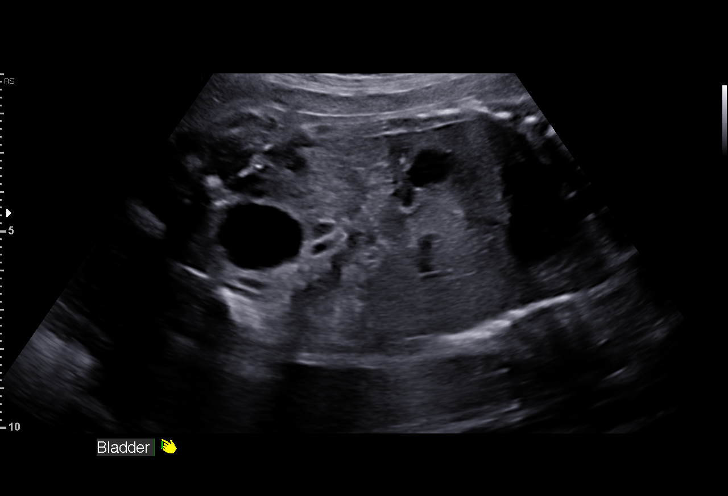
[im 39/46]
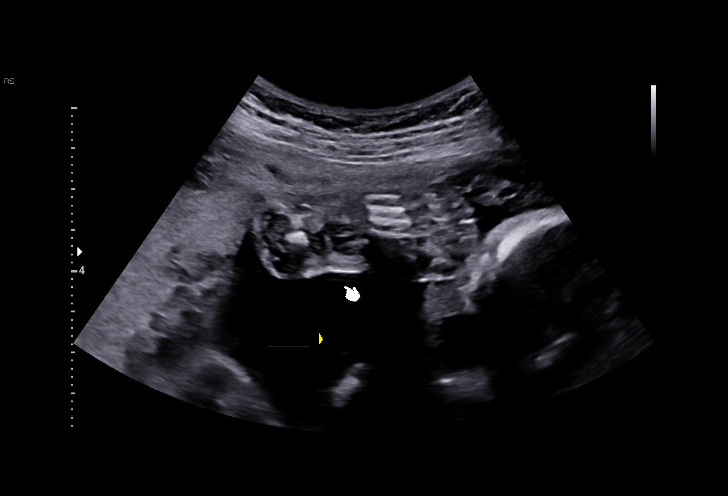
[im 42/46]
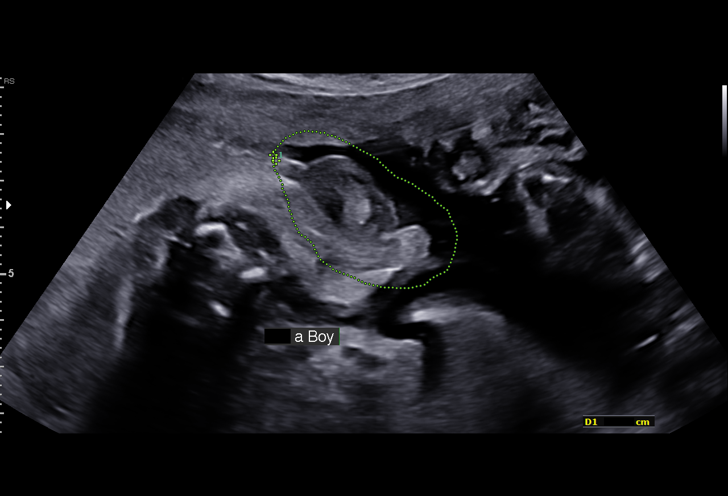
[im 46/46]
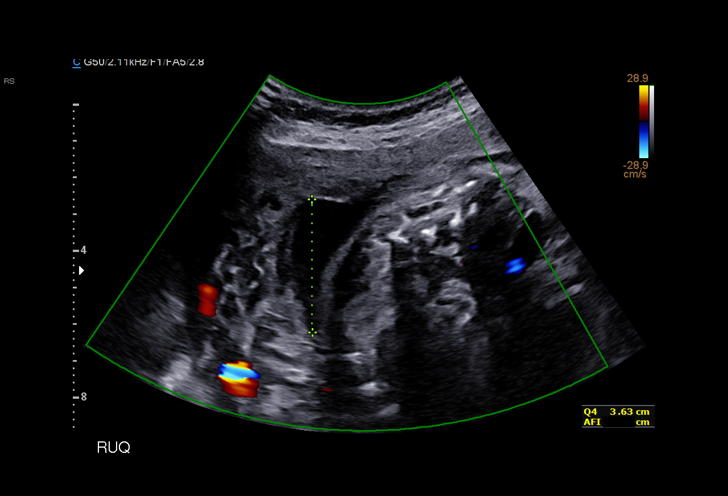

[14 of 28 positions shown; findings below may reference images not displayed]

OB/Gyn Clinic
[REDACTED]

1  LUSINE JIM            [id number]      [id number]     [id number]
Indications

28 weeks gestation of pregnancy
Poor obstetric history: Previous preterm
delivery, antepartum (34 weeks)
Short interval between pregnancies
complicating pregnancy, antepartum
Encounter for other antenatal screening
follow-up
Gestational diabetes in pregnancy,
controlled by oral hypoglycemic drugs
OB History

Blood Type:            Height:  5'1"   Weight (lb):  98        BMI:
Gravidity:    2         Term:   0        Prem:   1         SAB:   0
TOP:          0       Ectopic:  0        Living: 1
Fetal Evaluation

Num Of Fetuses:     1
Fetal Heart         142
Rate(bpm):
Cardiac Activity:   Observed
Presentation:       Cephalic
Placenta:           Posterior, above cervical os
Amniotic Fluid
AFI FV:      Subjectively within normal limits

AFI Sum(cm)     %Tile       Largest Pocket(cm)
15.46           55

RUQ(cm)       RLQ(cm)       LUQ(cm)        LLQ(cm)
4.38
Biometry

BPD:        72  mm     G. Age:  28w 6d         39  %    CI:         71.88  %    70 - 86
FL/HC:       18.9  %    19.6 -
HC:      270.3  mm     G. Age:  29w 3d         36  %    HC/AC:       1.11       0.99 -
AC:      242.8  mm     G. Age:  28w 4d         34  %    FL/BPD:      71.0  %    71 - 87
FL:       51.1  mm     G. Age:  27w 2d          7  %    FL/AC:       21.0  %    20 - 24
HUM:      47.8  mm     G. Age:  28w 0d         28  %

Est. FW:    7777   gm    2 lb 10 oz     39  %
Gestational Age

LMP:           28w 6d        Date:  06/26/16                 EDD:    04/02/17
U/S Today:     28w 4d                                        EDD:    04/04/17
Best:          28w 6d     Det. By:  LMP  (06/26/16)          EDD:    04/02/17
Anatomy

Cranium:               Appears normal         Aortic Arch:            Previously seen
Cavum:                 Previously seen        Ductal Arch:            Previously seen
Ventricles:            Appears normal         Diaphragm:              Previously seen
Choroid Plexus:        Previously seen        Stomach:                Appears normal, left
sided
Cerebellum:            Previously seen        Abdomen:                Appears normal
Posterior Fossa:       Previously seen        Abdominal Wall:         Previously seen
Nuchal Fold:           Not applicable (>20    Cord Vessels:           Previously seen
wks GA)
Face:                  Orbits and profile     Kidneys:                Appear normal
previously seen
Lips:                  Previously seen        Bladder:                Appears normal
Thoracic:              Appears normal         Spine:                  Previously seen
Heart:                 Appears normal         Upper Extremities:      Previously seen
(4CH, axis, and situs
RVOT:                  Previously seen        Lower Extremities:      Previously seen
LVOT:                  Appears normal

Other:  Fetus appears to be a male.Heels and 5th digit previously seen.
Nasal bone previously visualized.
Cervix Uterus Adnexa

Cervix
Not visualized (advanced GA >12wks)

Uterus
No abnormality visualized.

Left Ovary
Within normal limits.
Right Ovary
Within normal limits.

Adnexa:       No abnormality visualized. No adnexal mass
visualized.
Impression

SIUP at 28+6 weeks
Normal interval anatomy; anatomic survey complete
Normal amniotic fluid volume
Appropriate interval growth with EFW at the 39th %tile
Recommendations

Follow-up ultrasound for growth in 4-5 weeks

## 2018-12-19 IMAGING — US US MFM OB FOLLOW-UP
1 series · 13 of 28 positions shown · non-contrast
Comparison: none

[Series 1: us mfm ob follow-up · 13 of 59 slices shown]
[im 3/59]
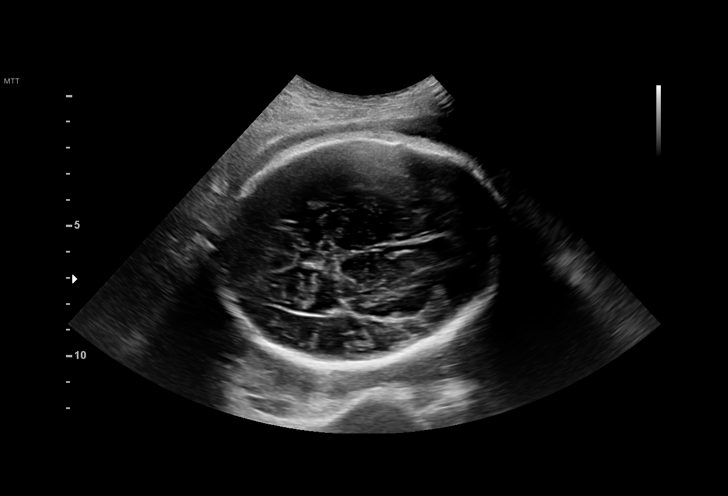
[im 7/59]
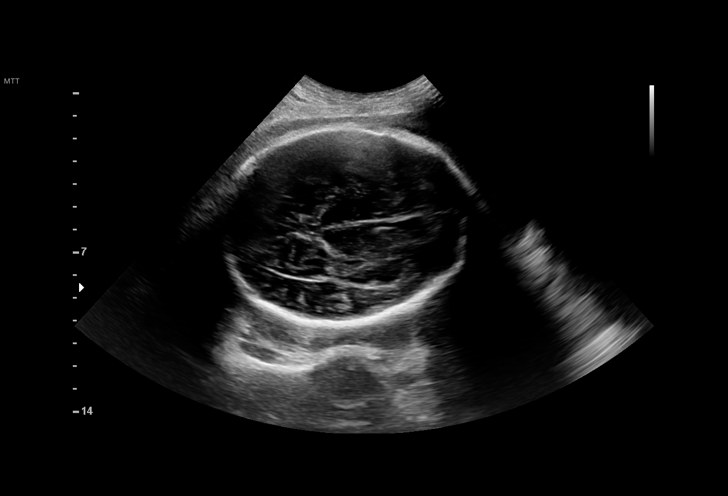
[im 11/59]
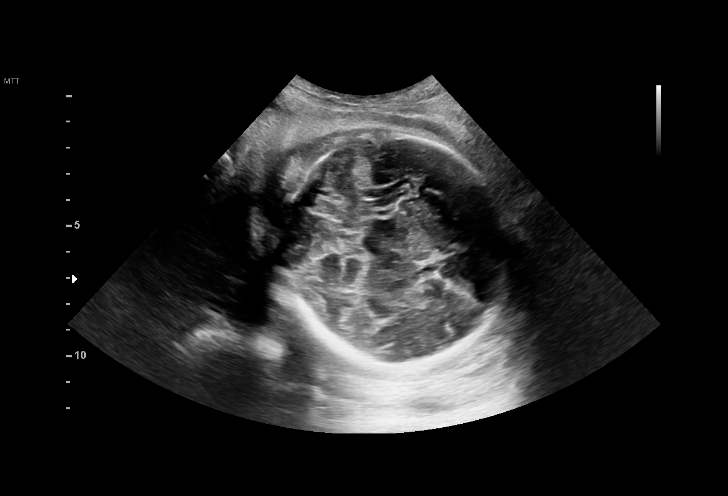
[im 16/59]
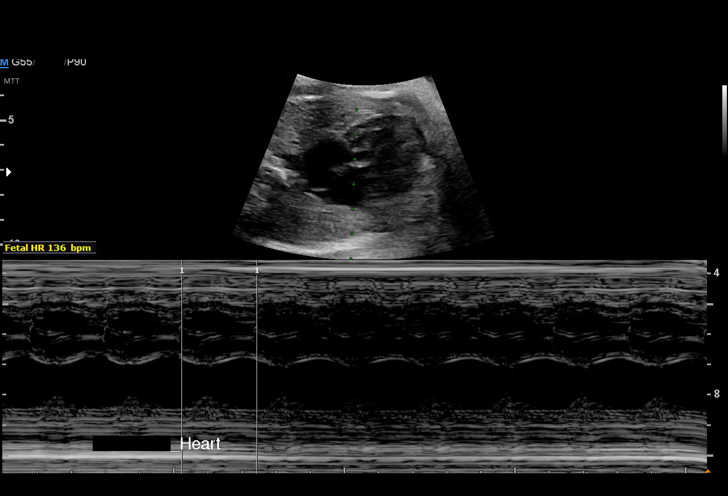
[im 20/59]
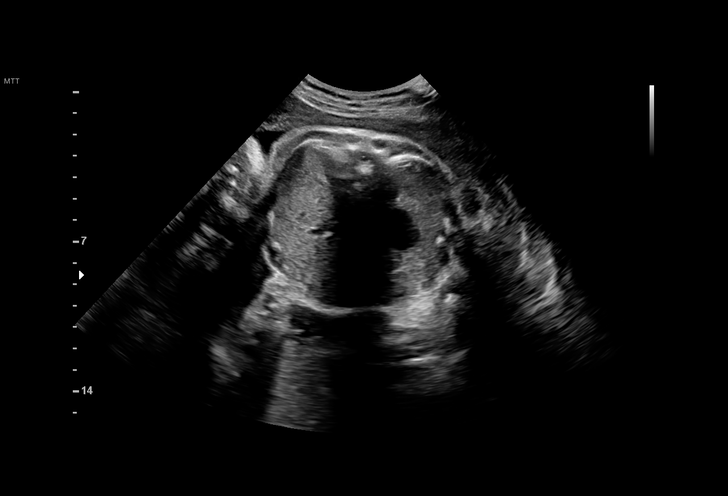
[im 24/59]
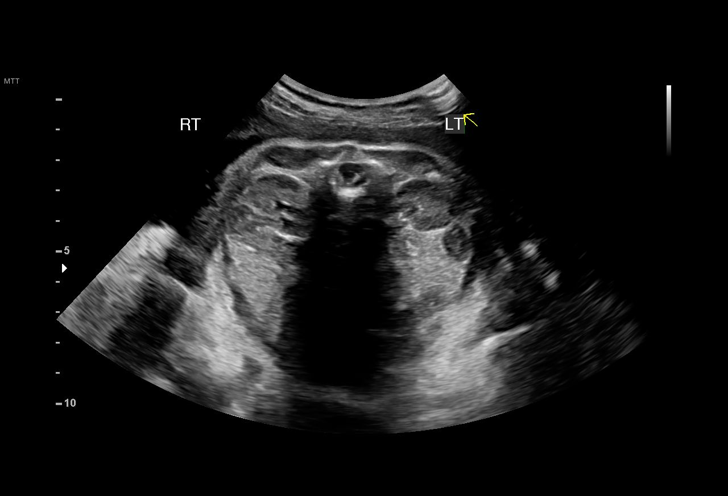
[im 31/59]
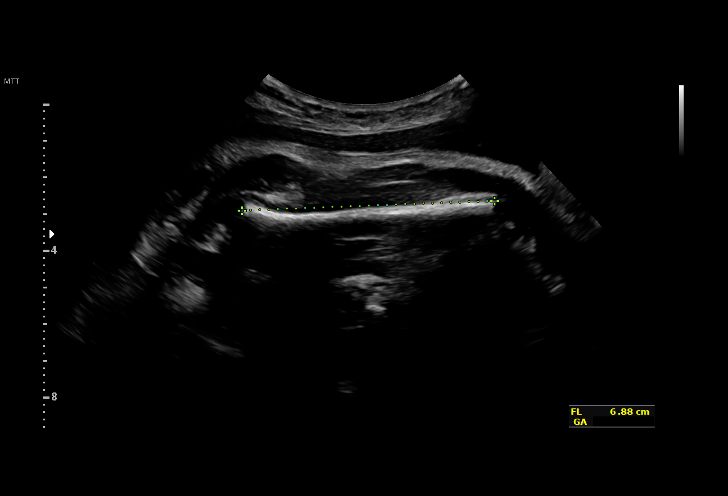
[im 35/59]
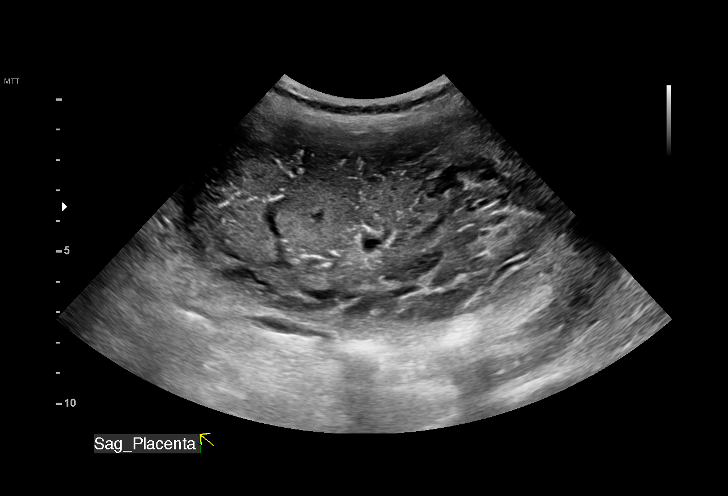
[im 39/59]
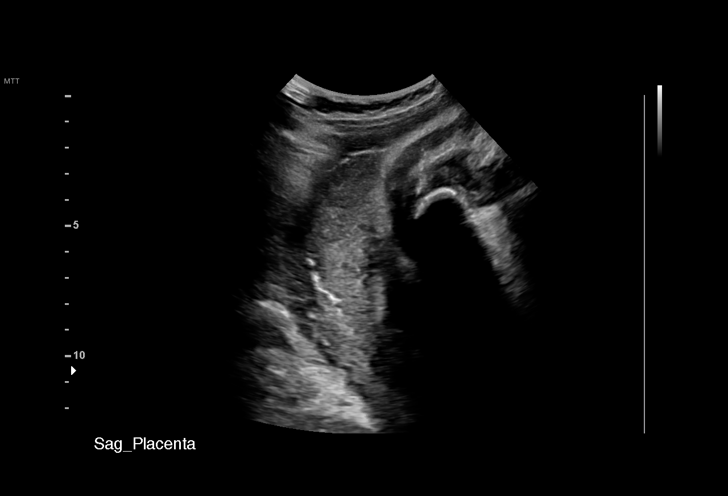
[im 43/59]
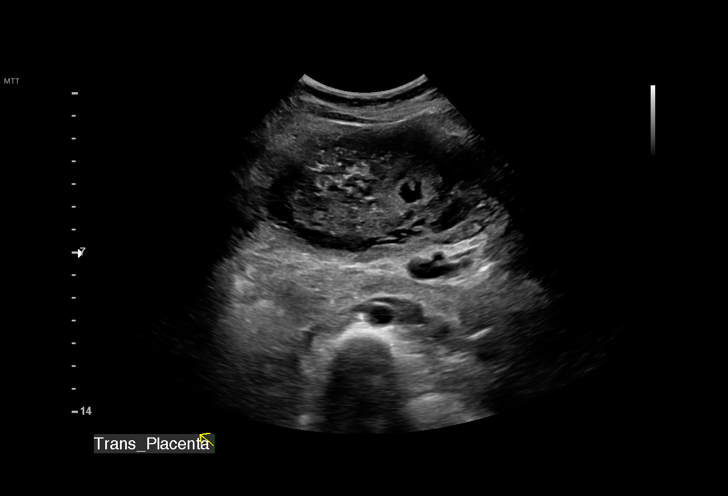
[im 48/59]
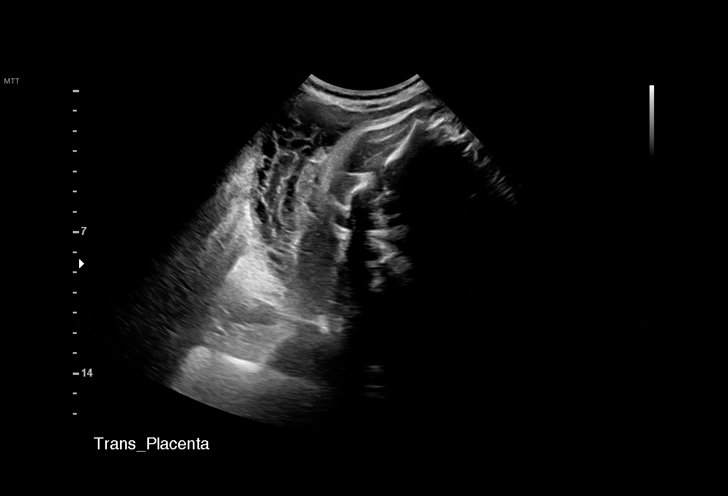
[im 52/59]
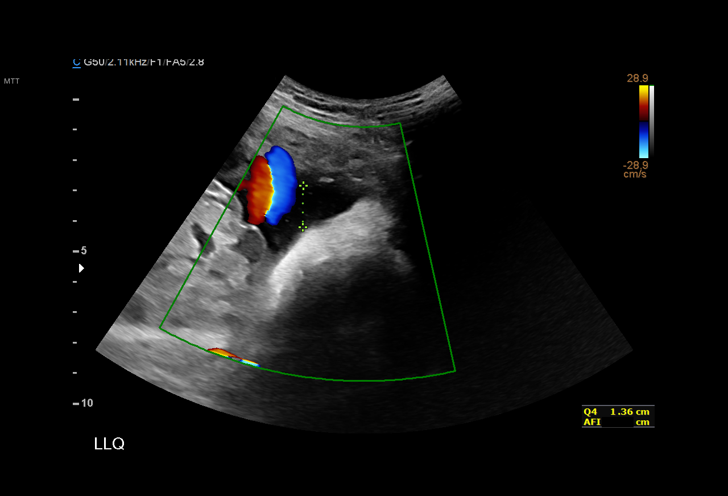
[im 56/59]
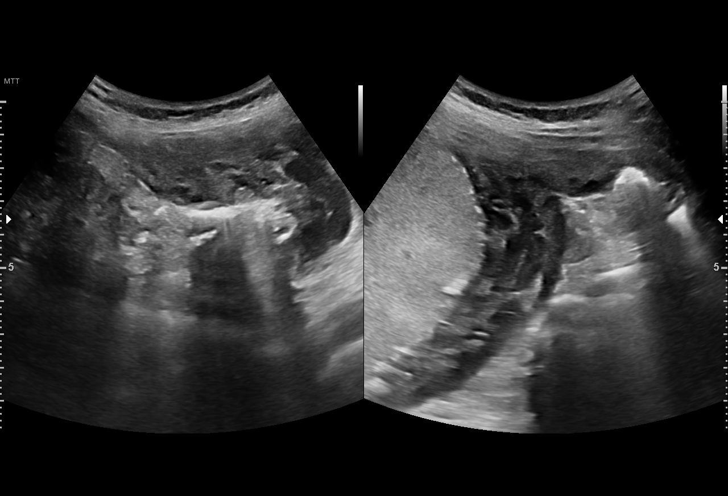

[13 of 28 positions shown; findings below may reference images not displayed]

OB/Gyn Clinic
[REDACTED]

1  CRASH GANDA            [id number]      [id number]     [id number]
Indications

36 weeks gestation of pregnancy
Poor obstetric history: Previous preterm
delivery, antepartum (34 weeks); 17P
Short interval between pregnancies
complicating pregnancy, antepartum
Encounter for other antenatal screening
follow-up
Gestational diabetes in pregnancy,
controlled by oral hypoglycemic drugs
OB History

Blood Type:            Height:  5'1"   Weight (lb):  98       BMI:
Gravidity:    2         Term:   0        Prem:   1        SAB:   0
TOP:          0       Ectopic:  0        Living: 1
Fetal Evaluation

Num Of Fetuses:     1
Fetal Heart         136
Rate(bpm):
Cardiac Activity:   Observed
Presentation:       Cephalic
Placenta:           Posterior, above cervical os
P. Cord Insertion:  Previously Visualized

Amniotic Fluid
AFI FV:      Subjectively within normal limits

AFI Sum(cm)     %Tile       Largest Pocket(cm)
12.49           42

RUQ(cm)       RLQ(cm)       LUQ(cm)        LLQ(cm)
5.34
Biometry

BPD:      85.9  mm     G. Age:  34w 5d         11  %    CI:        74.91   %   70 - 86
FL/HC:      21.9   %   20.8 -
HC:      314.9  mm     G. Age:  35w 2d          5  %    HC/AC:      1.01       0.92 -
AC:      311.2  mm     G. Age:  35w 0d         19  %    FL/BPD:     80.2   %   71 - 87
FL:       68.9  mm     G. Age:  35w 2d         18  %    FL/AC:      22.1   %   20 - 24
HUM:      58.8  mm     G. Age:  34w 0d         19  %
CER:      51.9  mm     G. Age:  N/A          > 95  %

Est. FW:    5899  gm    5 lb 12 oz      30  %
Gestational Age

LMP:           36w 5d       Date:   06/26/16                 EDD:   04/02/17
U/S Today:     35w 1d                                        EDD:   04/13/17
Best:          36w 5d    Det. By:   LMP  (06/26/16)          EDD:   04/02/17
Anatomy

Cranium:               Appears normal         Aortic Arch:            Previously seen
Cavum:                 Previously seen        Ductal Arch:            Previously seen
Ventricles:            Appears normal         Diaphragm:              Appears normal
Choroid Plexus:        Previously seen        Stomach:                Appears normal, left
sided
Cerebellum:            Previously seen        Abdomen:                Previously seen
Posterior Fossa:       Previously seen        Abdominal Wall:         Previously seen
Nuchal Fold:           Not applicable (>20    Cord Vessels:           Previously seen
wks GA)
Face:                  Orbits and profile     Kidneys:                Appear normal
previously seen
Lips:                  Previously seen        Bladder:                Appears normal
Thoracic:              Previously seen        Spine:                  Previously seen
Heart:                 Appears normal         Upper Extremities:      Previously seen
(4CH, axis, and situs
RVOT:                  Previously seen        Lower Extremities:      Previously seen
LVOT:                  Previously seen

Other:  Male gender previously seen. Heels and 5th digit previously seen.
Nasal bone previously visualized.  Technically difficult due to
advanced GA and fetal position.
Cervix Uterus Adnexa

Cervix
Not visualized (advanced GA >67wks)

Uterus
No abnormality visualized.
Left Ovary
Size(cm)     2.52  x    2.17   x  1.51      Vol(ml):
Within normal limits. No adnexal mass visualized.

Right Ovary
Size(cm)     2.47  x    1.89   x  2.14      Vol(ml):
Within normal limits. No adnexal mass visualized.

Cul De Sac:   No free fluid seen.

Adnexa:       No abnormality visualized.
Impression

Singleton intrauterine pregnancy at 36+5 weeks, with history
PTD and GDM. Here for growth evaluation
Cephalic presentation
Interval review of the anatomy shows no sonographic
markers for aneuploidy or structural anomalies
Amniotic fluid volume is normal with an AFI of 12.5cm
Estimated fetal weight is 2600g which is growth in the 30th
percentile
Recommendations

No further MFM US are needed at this time. Follow-up
ultrasounds as clinically indicated.

## 2020-04-06 ENCOUNTER — Encounter: Payer: Self-pay | Admitting: *Deleted
